# Patient Record
Sex: Female | Born: 1935 | Race: White | Hispanic: No | Smoking: Never smoker
Health system: Southern US, Community
[De-identification: ages and names within clinical notes are randomized; demographics above are authoritative.]

## PROBLEM LIST (undated history)

## (undated) DIAGNOSIS — E785 Hyperlipidemia, unspecified: Secondary | ICD-10-CM

## (undated) DIAGNOSIS — I509 Heart failure, unspecified: Secondary | ICD-10-CM

## (undated) DIAGNOSIS — N19 Unspecified kidney failure: Secondary | ICD-10-CM

## (undated) DIAGNOSIS — D649 Anemia, unspecified: Secondary | ICD-10-CM

## (undated) DIAGNOSIS — J449 Chronic obstructive pulmonary disease, unspecified: Secondary | ICD-10-CM

## (undated) DIAGNOSIS — I739 Peripheral vascular disease, unspecified: Secondary | ICD-10-CM

## (undated) HISTORY — PX: REPLACEMENT TOTAL KNEE: SUR1224

## (undated) HISTORY — PX: SHOULDER SURGERY: SHX246

## (undated) HISTORY — PX: ABDOMINAL HYSTERECTOMY: SHX81

---

## 2004-12-09 ENCOUNTER — Inpatient Hospital Stay: Payer: Self-pay | Admitting: General Practice

## 2004-12-13 ENCOUNTER — Other Ambulatory Visit: Payer: Self-pay

## 2004-12-17 ENCOUNTER — Other Ambulatory Visit: Payer: Self-pay

## 2007-11-28 IMAGING — CR DG KNEE 1-2V*R*
1 series · 2 of 2 positions shown · non-contrast
Comparison: none

REASON FOR EXAM: Postop
COMMENTS:  Bedside (portable):Y

[Series 1: view not recorded · 0.17mm/px · 2 of 2 slices shown]
[im 1/2]
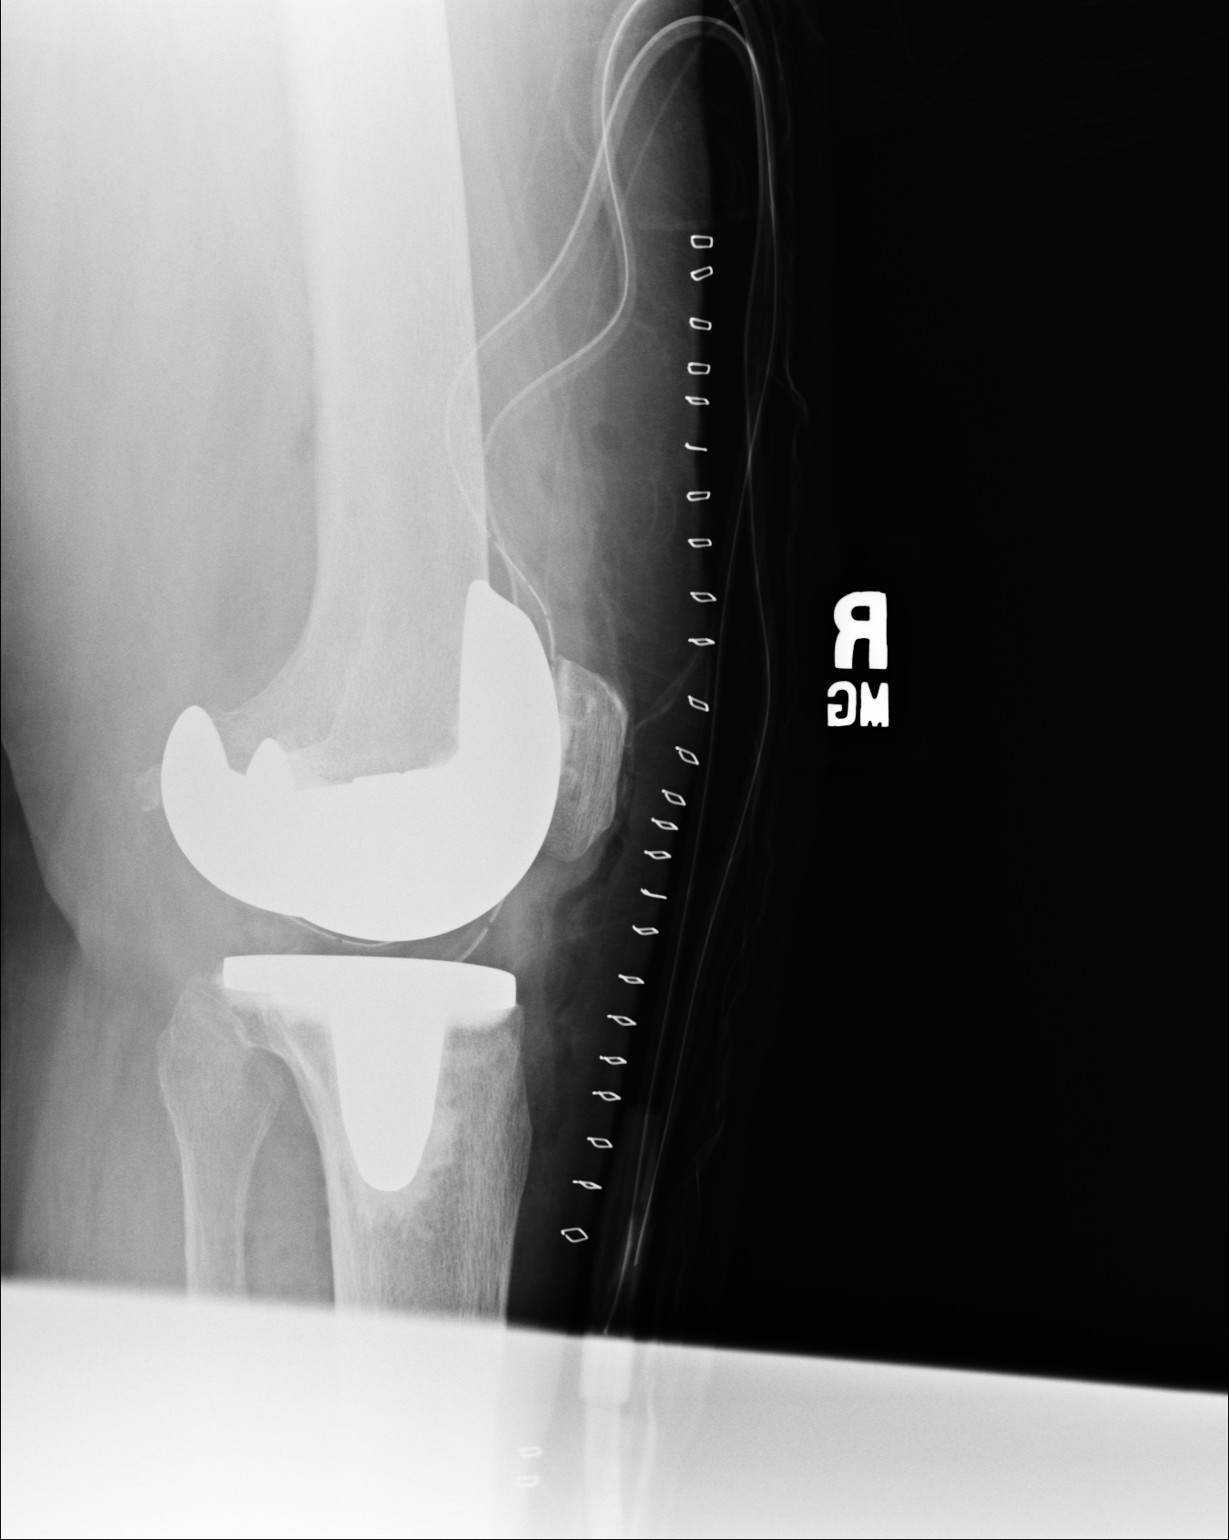
[im 2/2]
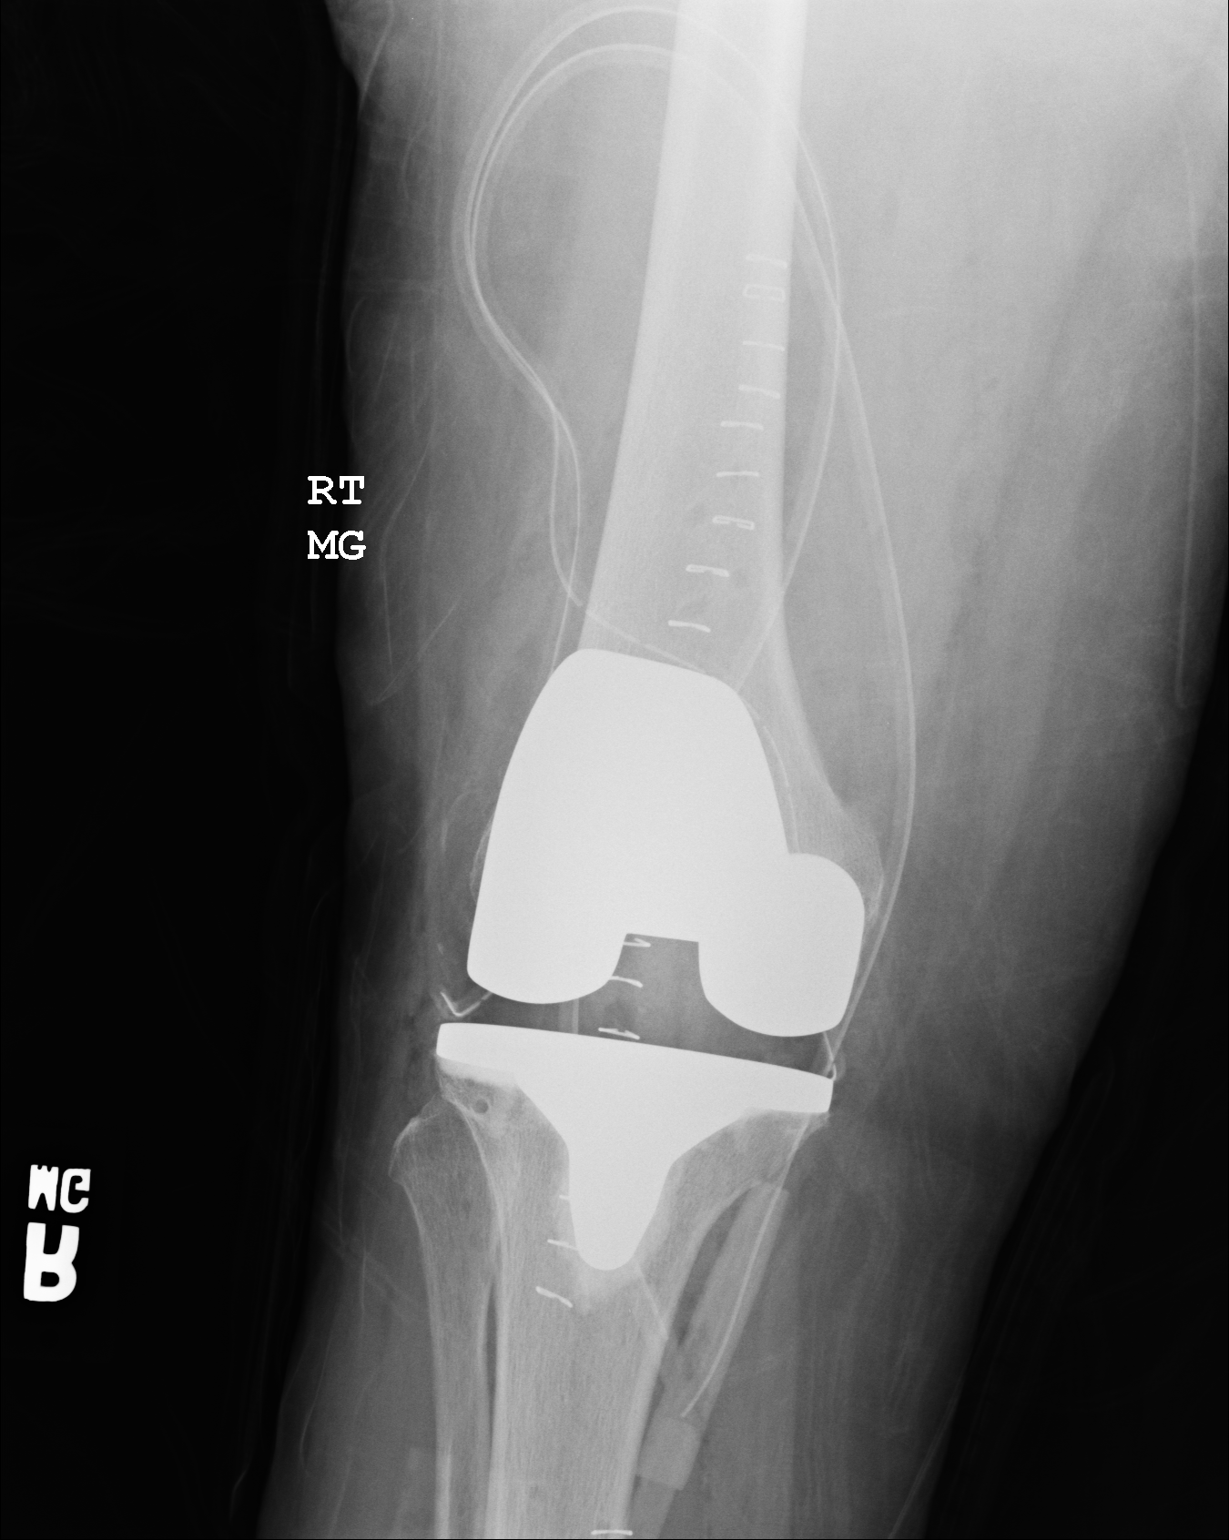

[2 of 2 positions shown; findings below may reference images not displayed]

PROCEDURE:     DXR - DXR KNEE RIGHT AP AND LATERAL  - December 09, 2004 [DATE]

RESULT:        The patient is status post RIGHT knee arthroplasty.  The
femoral and tibial components appear well seated without evidence of
loosening or hardware failure.  Skin staples are demonstrated about the skin
and surgical drains are demonstrated within the knee.  The native visualized
bony skeleton demonstrates no evidence of fracture or dislocation.
IMPRESSION: Status post RIGHT knee arthroplasty.  The remainder of the interpretation
will be left to the performing physician.

## 2007-11-28 IMAGING — CR DG CHEST 1V PORT
1 series · 1 of 1 positions shown · non-contrast
Comparison: none

REASON FOR EXAM: wheezing
COMMENTS:

PROCEDURE:     DXR - DXR PORTABLE CHEST SINGLE VIEW  - December 09, 2004 [DATE]
RESULT:     There is no evidence of focal infiltrates, effusions or edema.
The cardiac silhouette is within normal limits. The visualized bony skeleton
demonstrates no evidence of acute abnormalities.

[view not recorded]
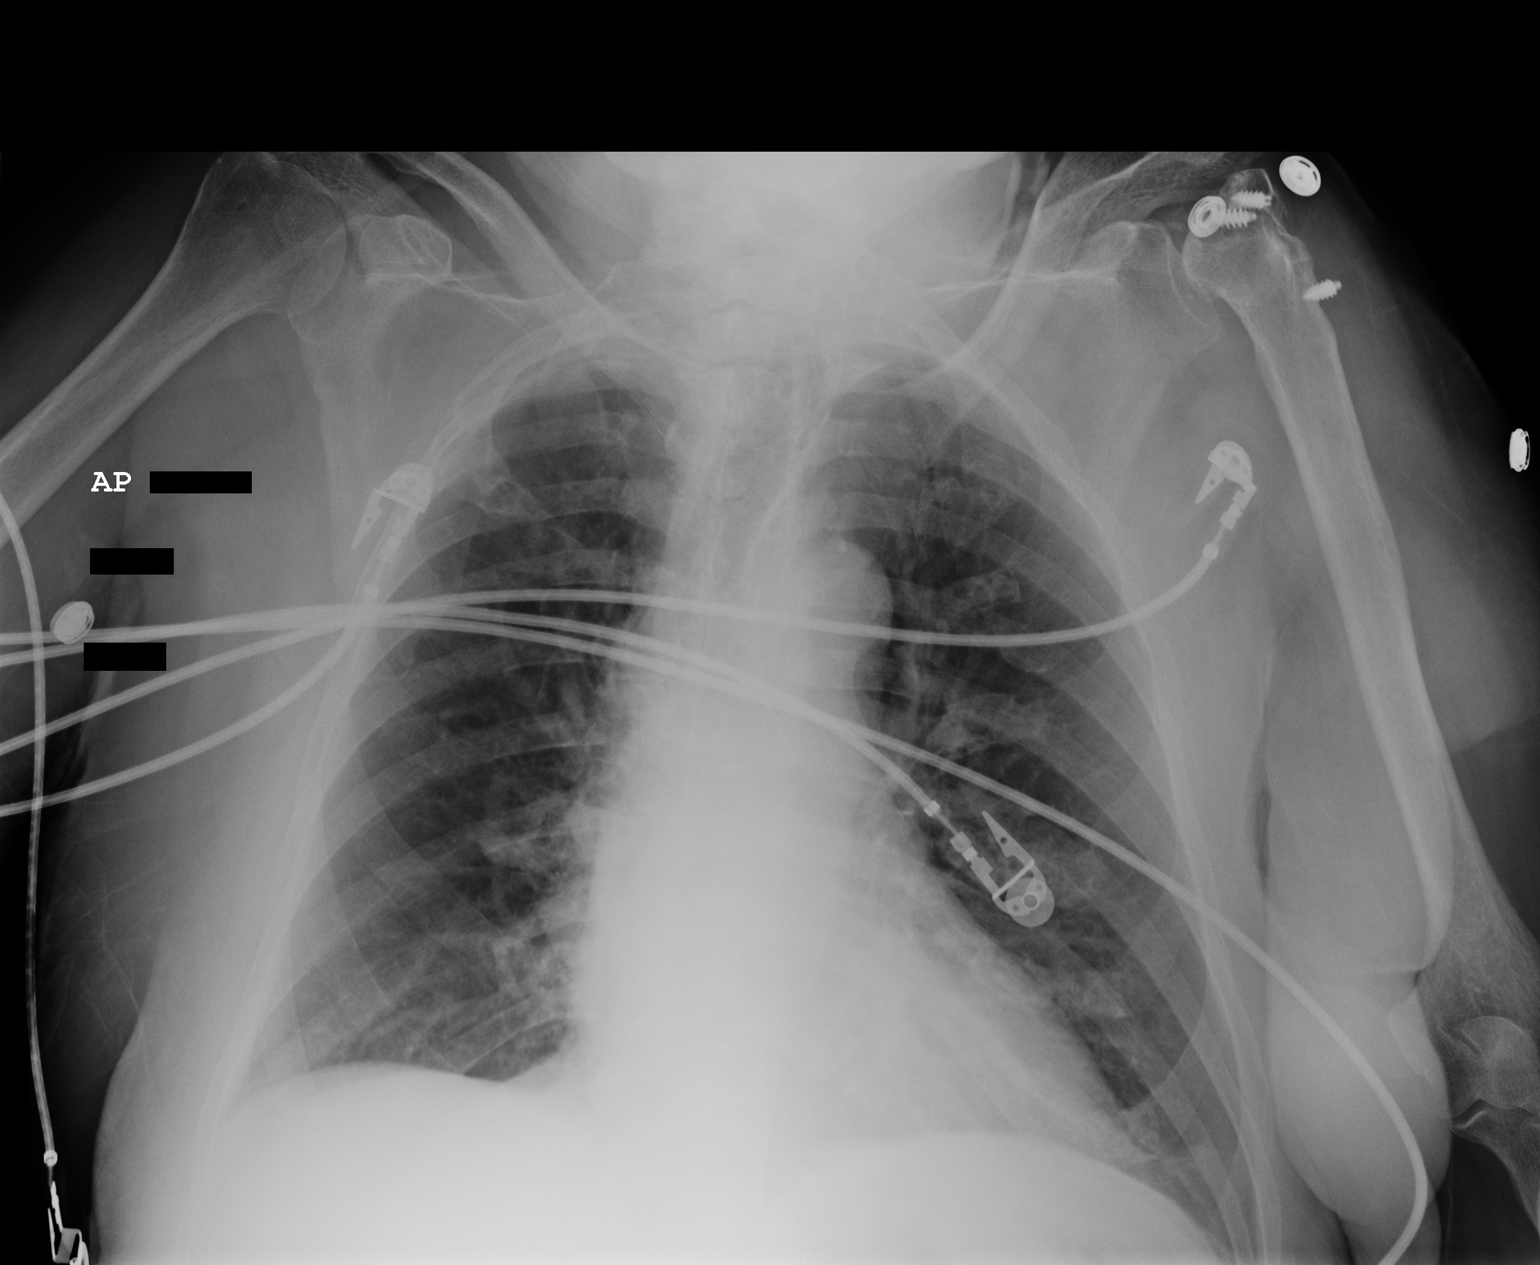

[1 of 1 positions shown; findings below may reference images not displayed]

IMPRESSION: 1)Chest radiograph without evidence of acute cardiopulmonary disease.

## 2007-11-30 IMAGING — CR DG CHEST 1V
1 series · 1 of 1 positions shown · non-contrast
Comparison: none

REASON FOR EXAM: Congestion
COMMENTS:

[view not recorded]
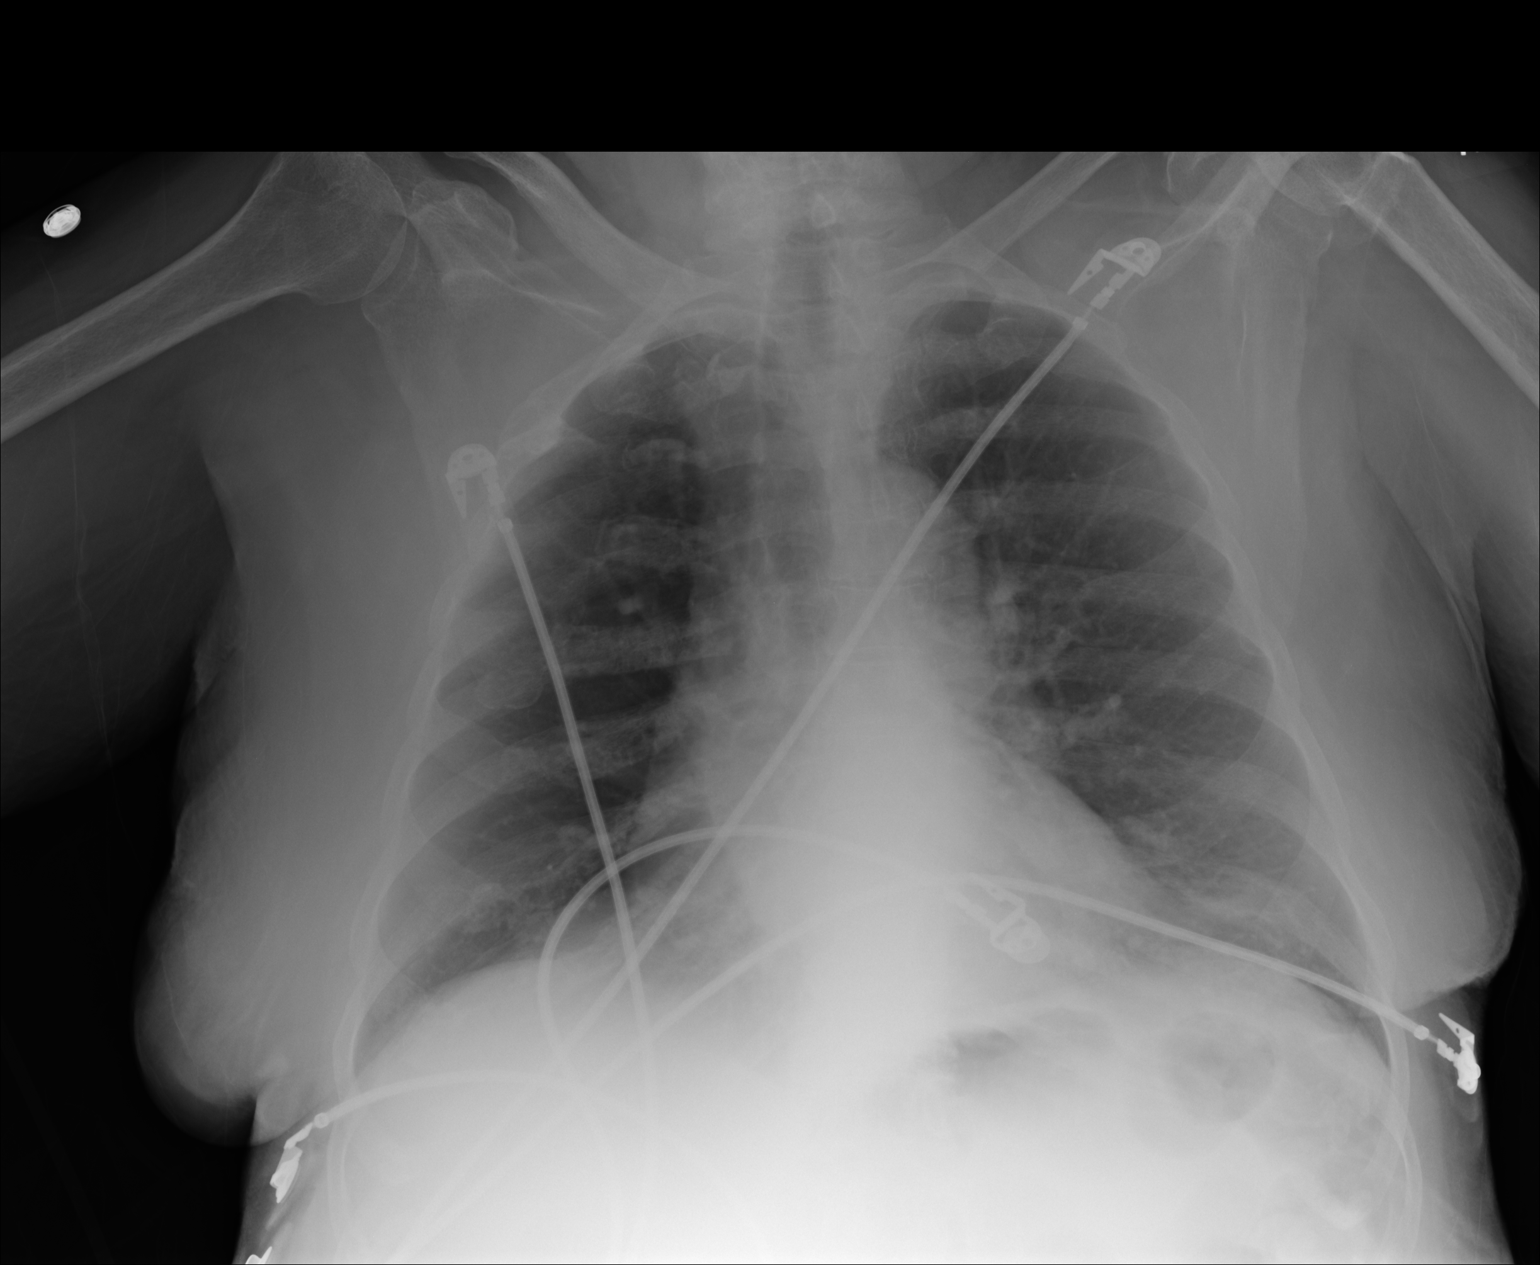

[1 of 1 positions shown; findings below may reference images not displayed]

PROCEDURE:     DXR - DXR CHEST 1 VIEWAP OR PA  - December 11, 2004  [DATE]

RESULT:     Single AP view was obtained and compared to a prior exam of
12/09/2004.

The heart is within normal limits in size. The lung fields are clear. The
vascularity is within normal limits with no effusions noted. Post-surgical
changes are noted of the LEFT shoulder.
IMPRESSION: The lung fields are clear. The chest is basically unchanged
from the prior study.

## 2007-12-02 IMAGING — CR DG CHEST 1V PORT
1 series · 1 of 1 positions shown · non-contrast
Comparison: none

REASON FOR EXAM: Evaluate for pneumonia, infiltrate
COMMENTS:

PROCEDURE:     DXR - DXR PORTABLE CHEST SINGLE VIEW  - December 13, 2004  [DATE]
RESULT:          AP portable exam was obtained and compared to the prior
study of 12/11/2004.
The heart remains normal in size.  The lung fields appear clear with no
effusions.

[view not recorded]
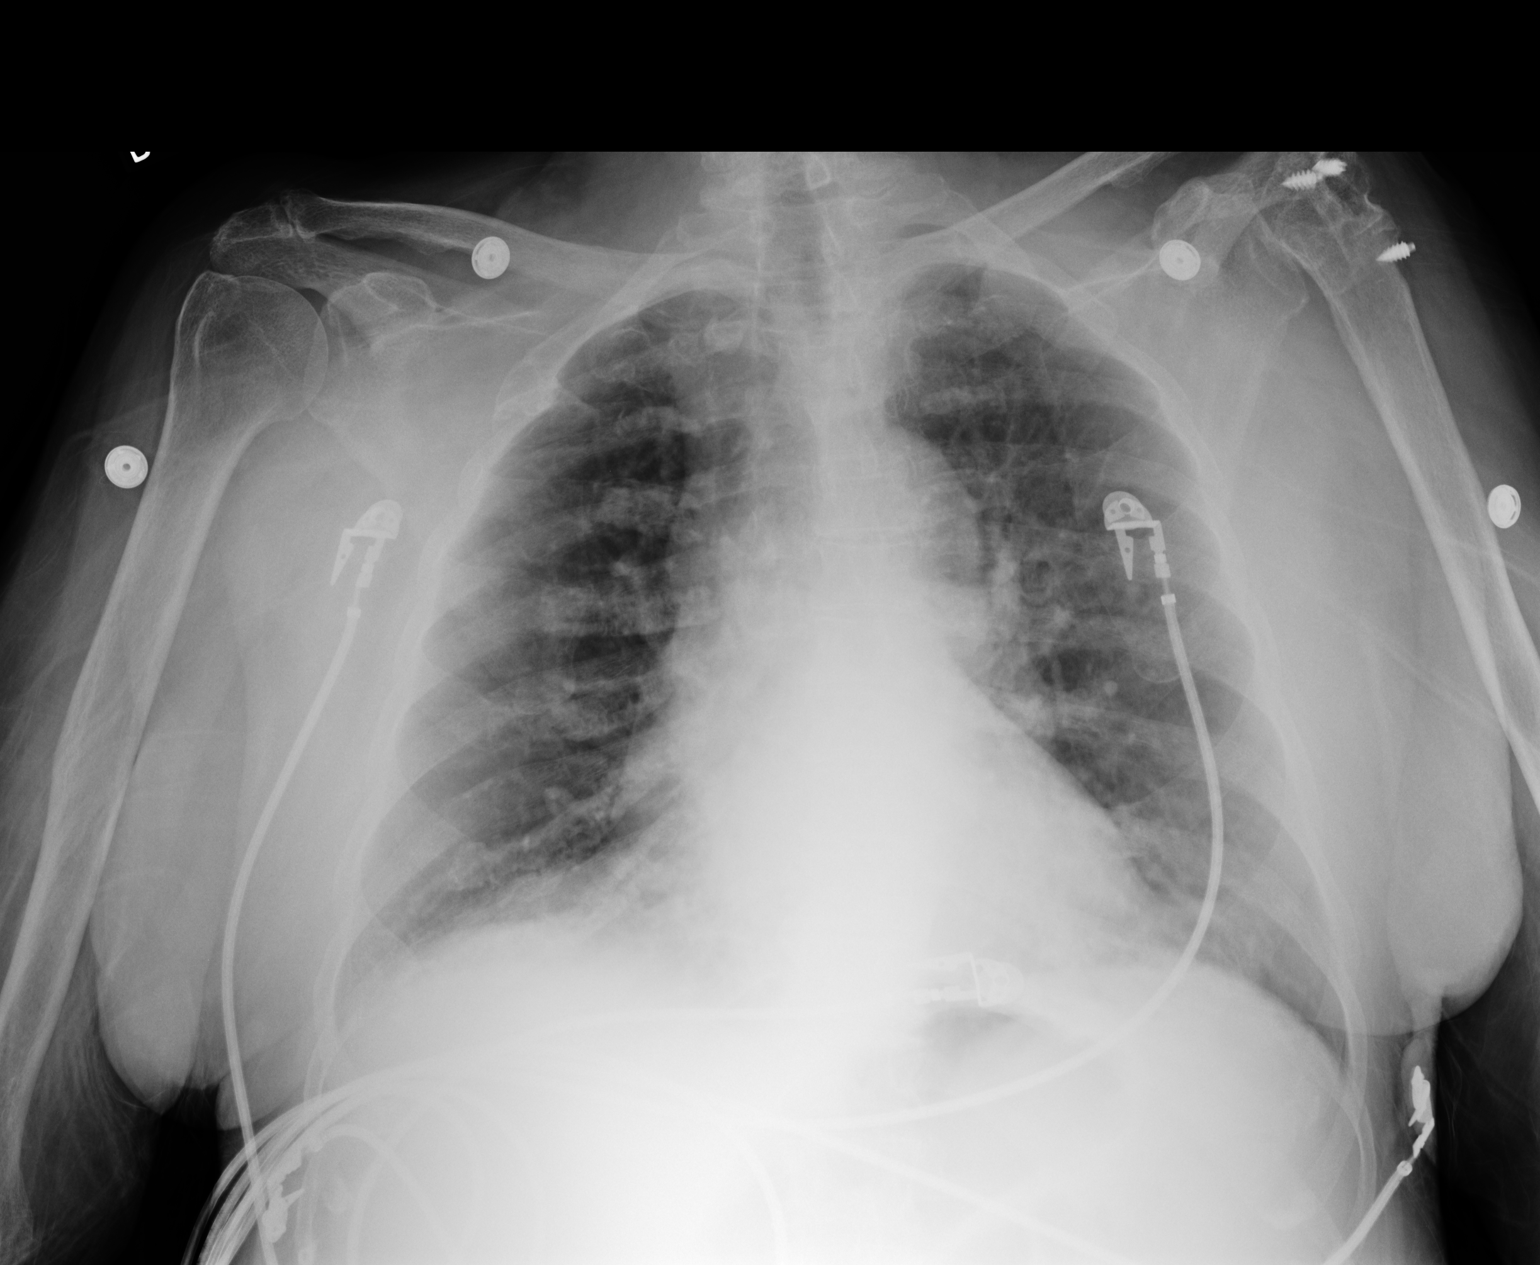

[1 of 1 positions shown; findings below may reference images not displayed]

IMPRESSION: The lung fields are clear.

## 2007-12-04 IMAGING — CT CT CHEST W/ CM
1 series · 15 of 34 positions shown, 19 images · non-contrast
Comparison: none

REASON FOR EXAM: Hypoxemia, tachycardia
COMMENTS:

[Series 4: soft tissue · axial · 0.59mm/px · z∈[-592,-330]mm · 15 of 103 slices shown, 19 images]
[im 8/103  mediastinal]
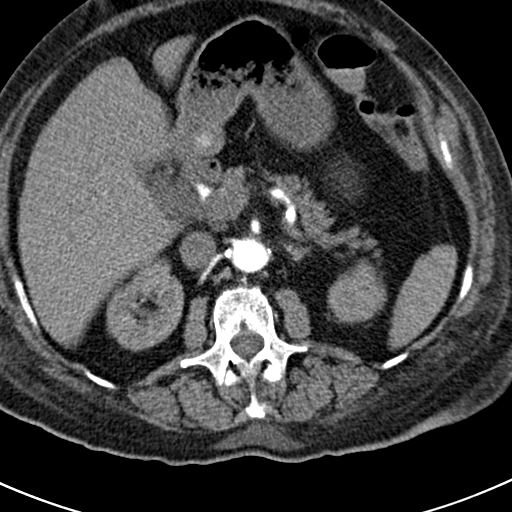
[im 8/103  lung]
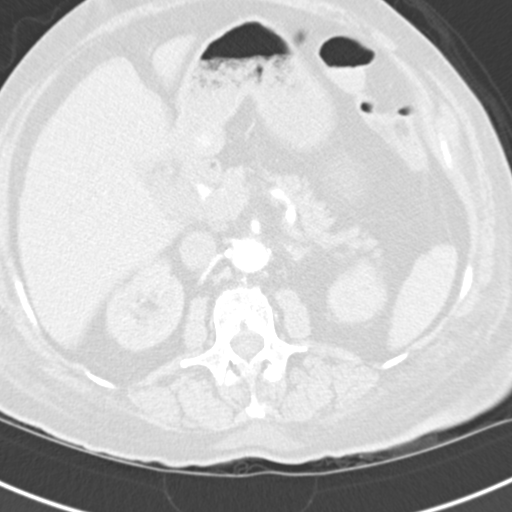
[im 16/103  lung]
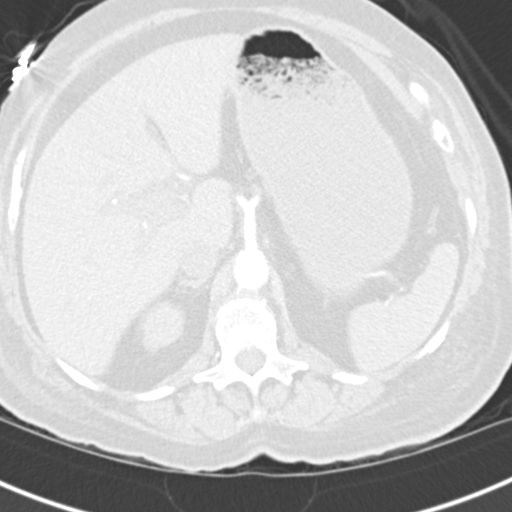
[im 21/103  lung]
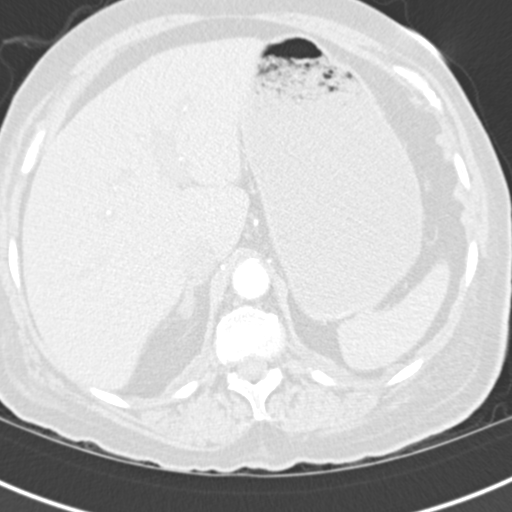
[im 27/103  lung]
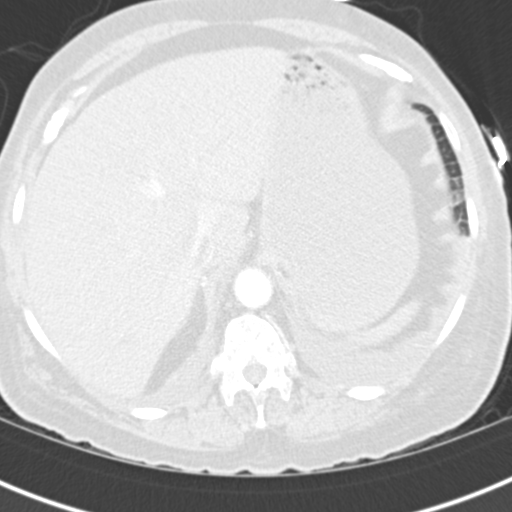
[im 35/103  mediastinal]
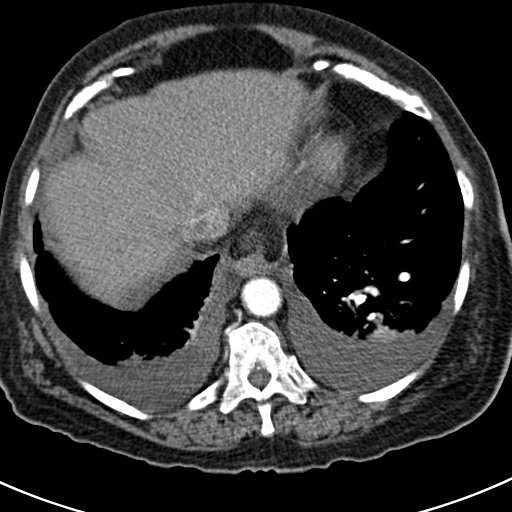
[im 35/103  lung]
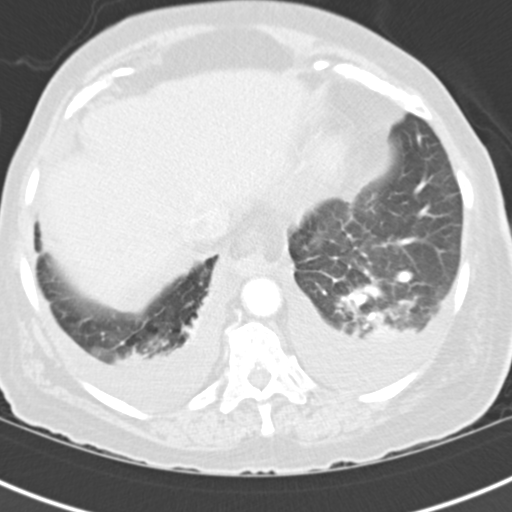
[im 41/103  lung]
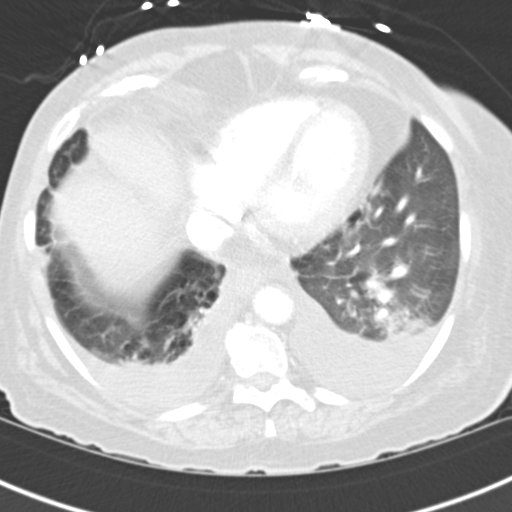
[im 46/103  lung]
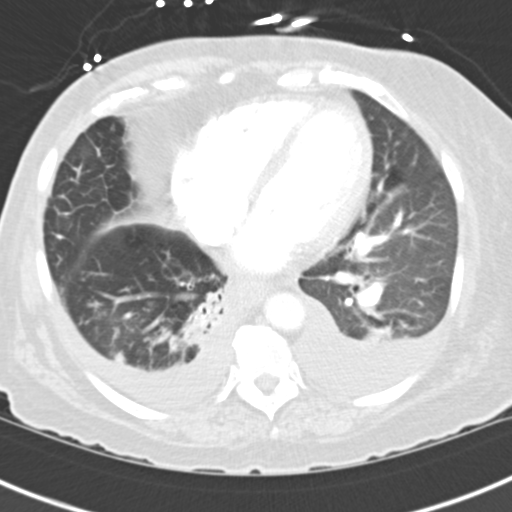
[im 53/103  lung]
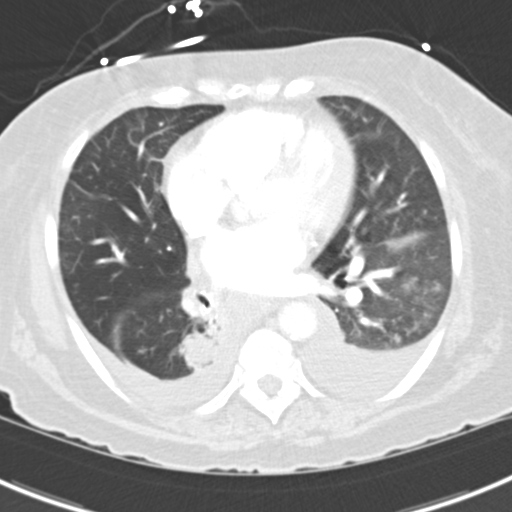
[im 57/103  mediastinal]
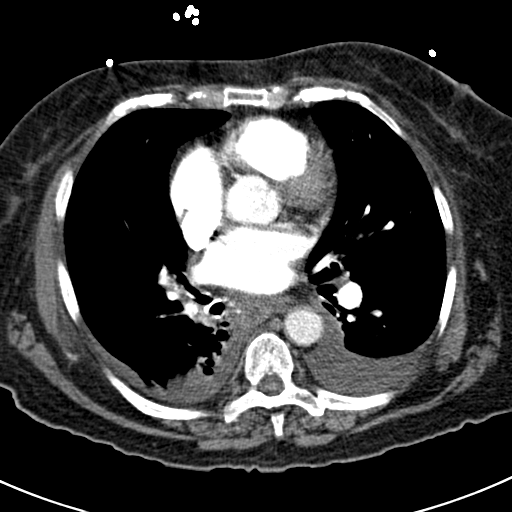
[im 57/103  lung]
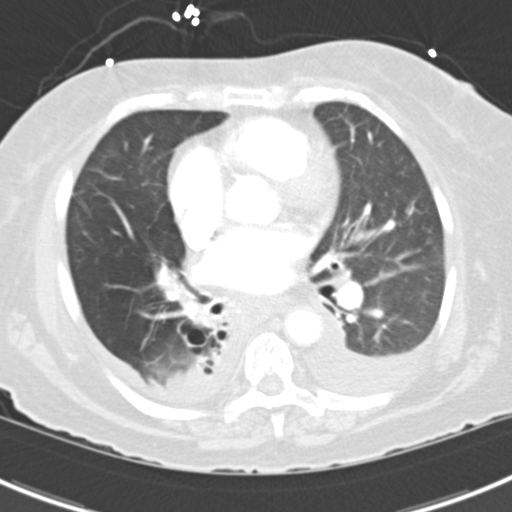
[im 62/103  lung]
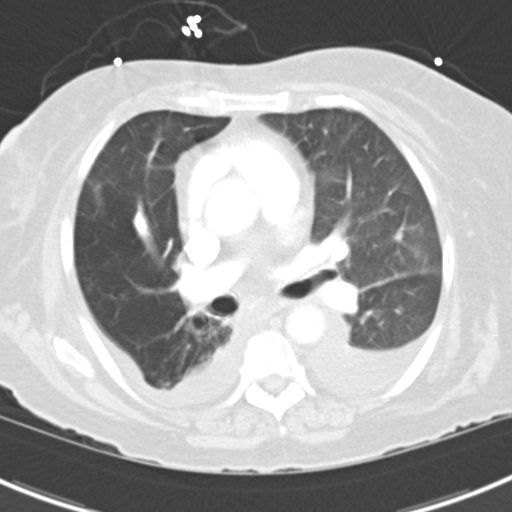
[im 69/103  lung]
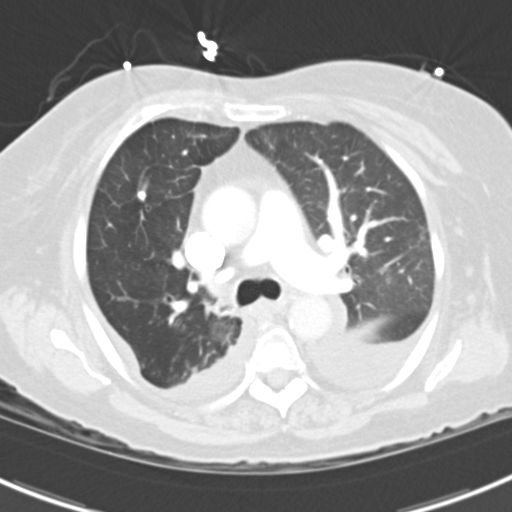
[im 76/103  lung]
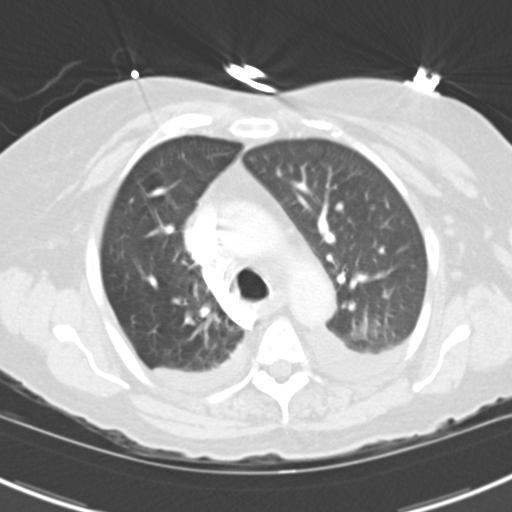
[im 82/103  mediastinal]
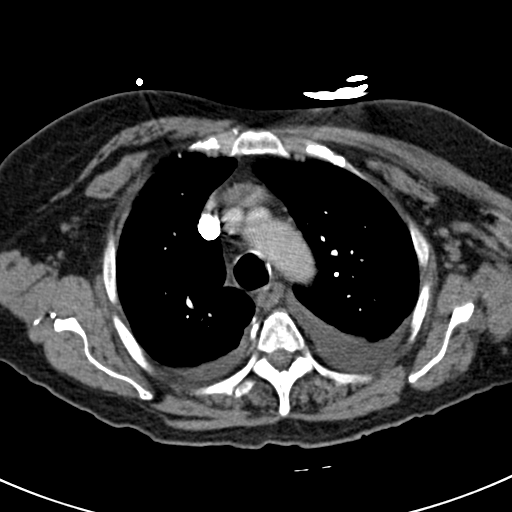
[im 82/103  lung]
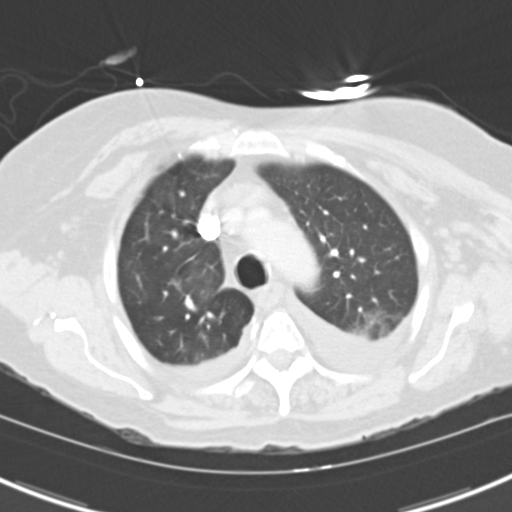
[im 87/103  lung]
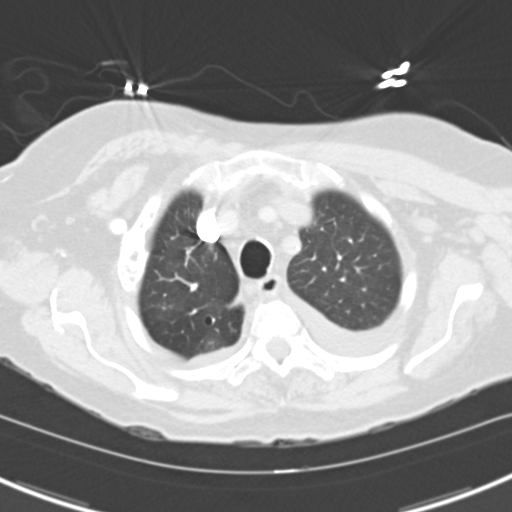
[im 95/103  lung]
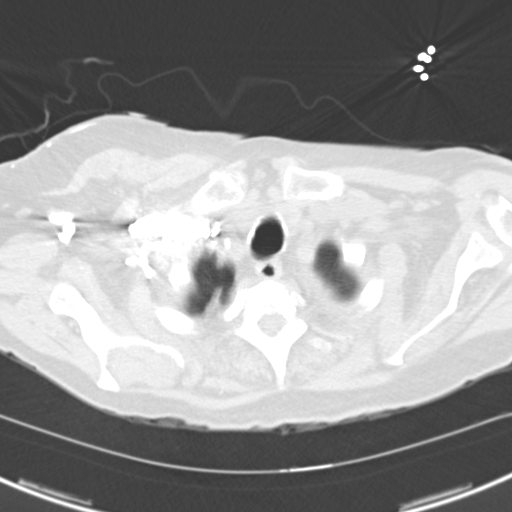

[15 of 34 positions shown; findings below may reference images not displayed]

PROCEDURE:     CT  - CT CHEST (FOR PE) W  - December 15, 2004  [DATE]

RESULT:     Spiral 3.0 mm sections were obtained from the thoracic inlet
through the lung bases status post intravenous administration of 100 ml of
[id number].

Evaluation of the mediastinum and hilar regions and structures demonstrates
no evidence of mediastinal masses or adenopathy. Evaluation of the pulmonary
arterial system demonstrates a filling defect within a lower lobe segmental
pulmonary artery. This is in a region of consolidated density within the
RIGHT lung base as well as a small, RIGHT effusion. This filling defect may
represent the sequelae of a pulmonary embolus versus possibly an element of
hypoperfusion secondary to the lung disease. The remaining pulmonary
arterial system demonstrates no further evidence of filling defects to
suggest further pulmonary emboli. Evaluation of the lung parenchyma
demonstrates patchy areas of increased density within the lung bases with
consolidated density in the RIGHT lung base. Small, bilateral effusions are
appreciated, LEFT greater than RIGHT. Evaluation of the visualized upper
abdominal viscera demonstrates no gross abnormalities.
IMPRESSION: 1.     Filling defects within a lower lobe segmental pulmonary artery
suspicious for a pulmonary embolus on the RIGHT.
2.     Consolidated densities within the lung bases with atelectasis versus
infiltrate as well as small effusions.
3.     Not mentioned above, there does appear to be an element of pulmonary
edema and there is multi-chamber cardiac enlargement.
4.     Dr. Klpigbb Moolman of the [[facility]] was informed of the
findings of a pulmonary embolus at the time of this initial interpretation.

## 2007-12-04 IMAGING — CR DG CHEST 2V
1 series · 2 of 2 positions shown · non-contrast
Comparison: none

REASON FOR EXAM: crackles > on R
COMMENTS:

[Series 1: view not recorded · 0.17mm/px · 2 of 2 slices shown]
[im 1/2]
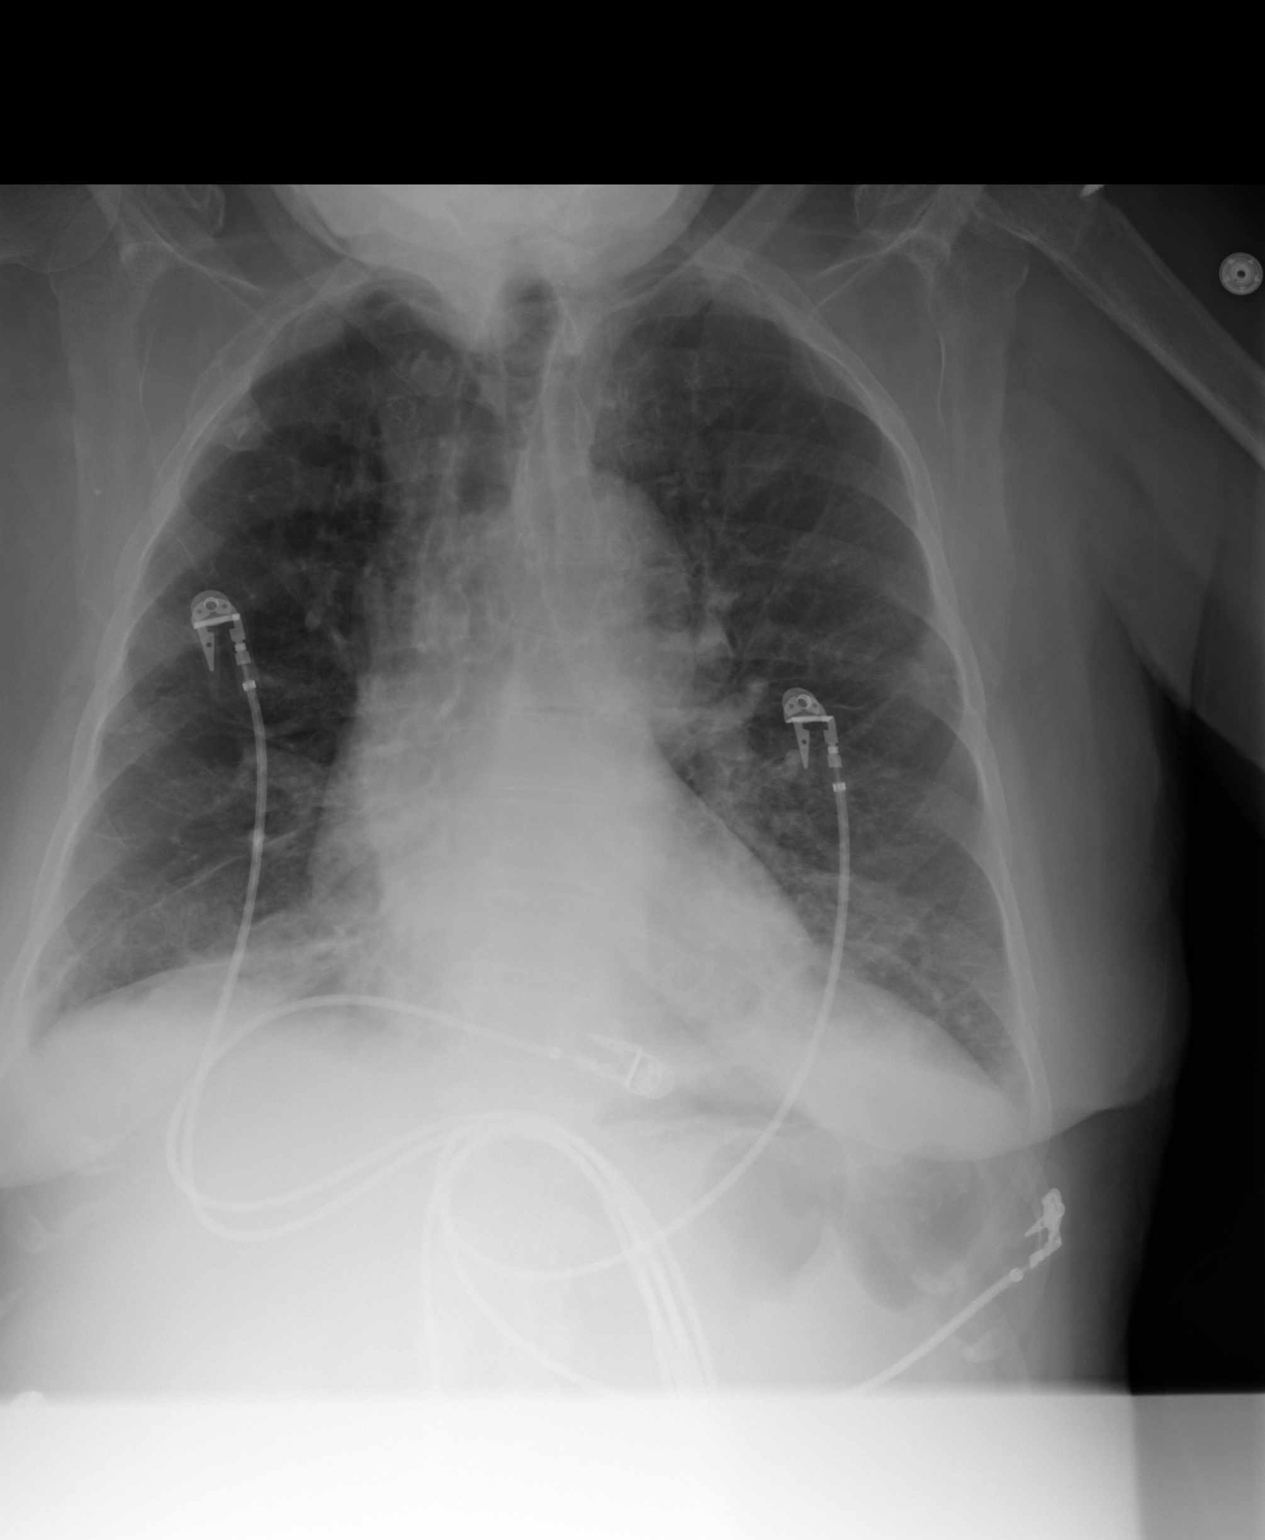
[im 2/2]
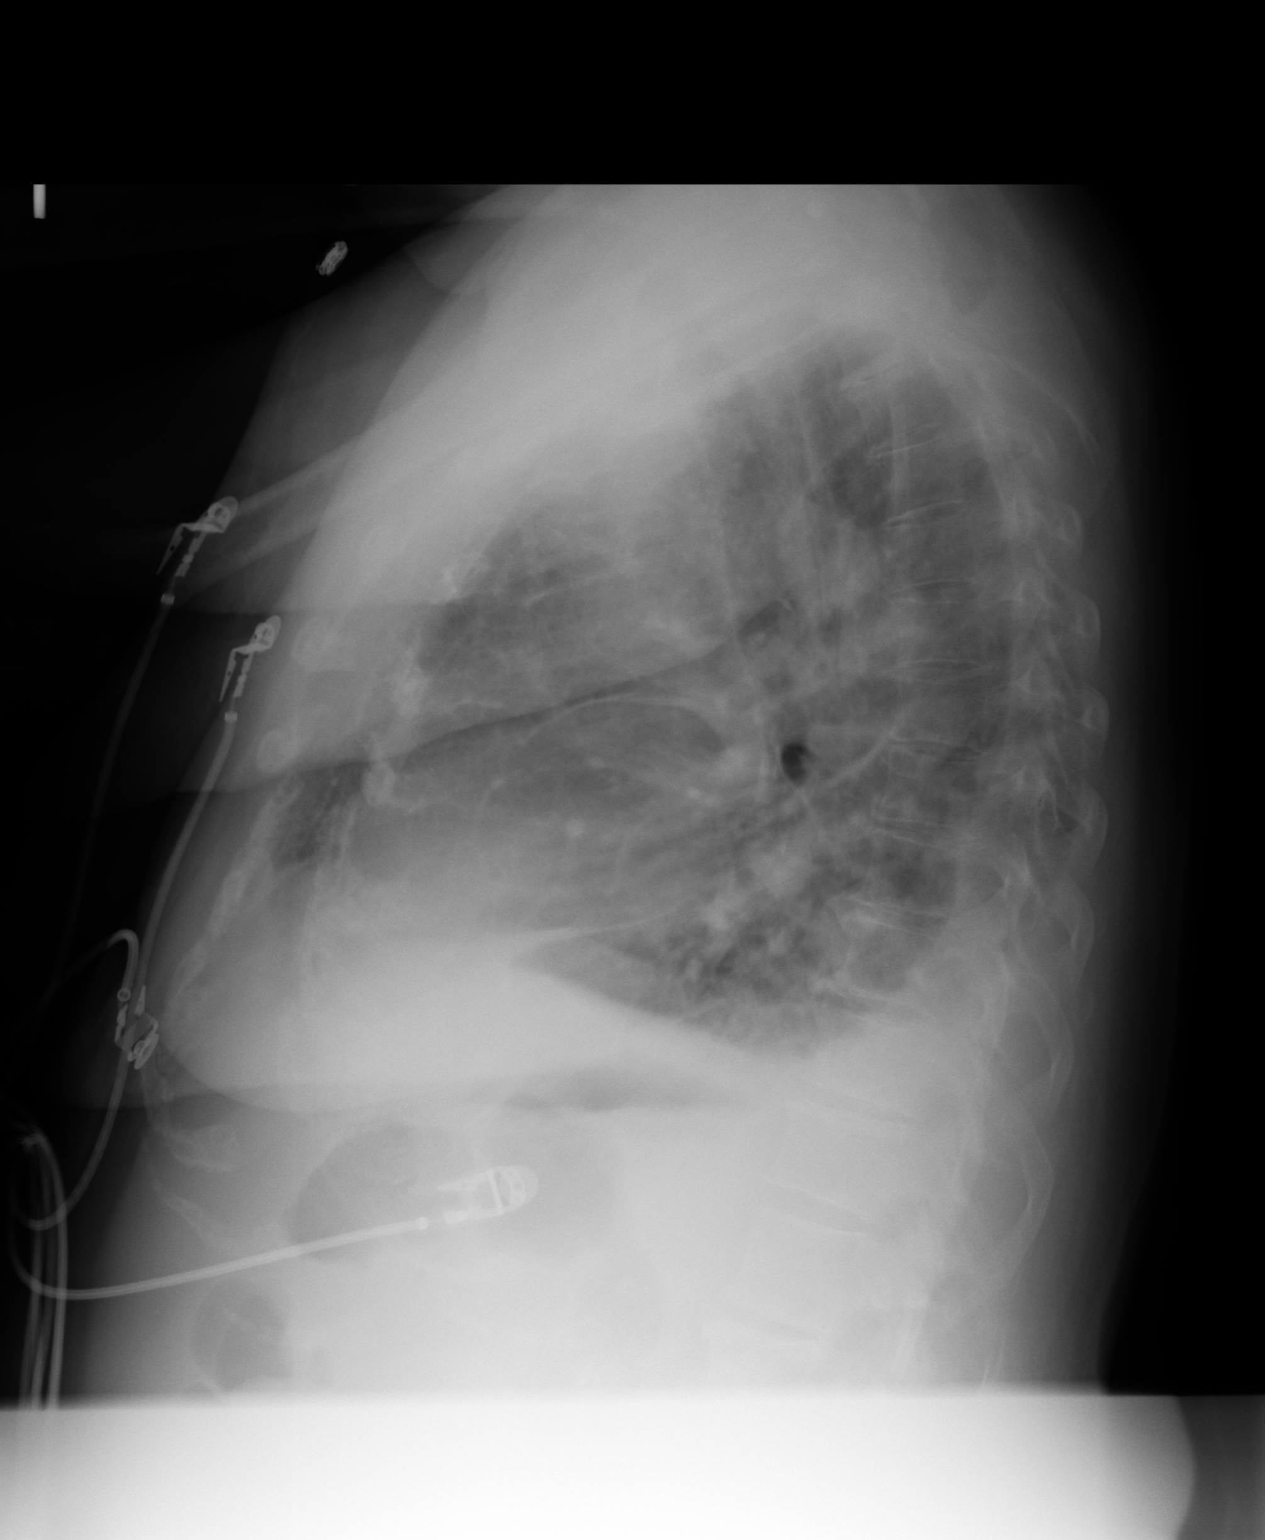

[2 of 2 positions shown; findings below may reference images not displayed]

PROCEDURE:     DXR - DXR CHEST PA (OR AP) AND LATERAL  - December 15, 2004  [DATE]

RESULT:     The current exam is compared to the prior exam of 12-13-04.  The
current exam shows patchy thickening of the LEFT basilar markings compatible
with pneumonia or atelectasis.  The RIGHT lung field is clear. No pulmonary
edema is seen. Heart size is normal. Monitoring electrodes are present.
IMPRESSION: 1)There is mild patchy thickening of the LEFT basilar markings compatible
with pneumonia or atelectasis.

## 2007-12-05 IMAGING — US US EXTREM LOW VENOUS BILAT
1 series · 17 of 24 positions shown · non-contrast
Comparison: none

REASON FOR EXAM: Possible PE
COMMENTS:

[Series 1: us extrem low venous bilat · 17 of 37 slices shown]
[im 1/37]
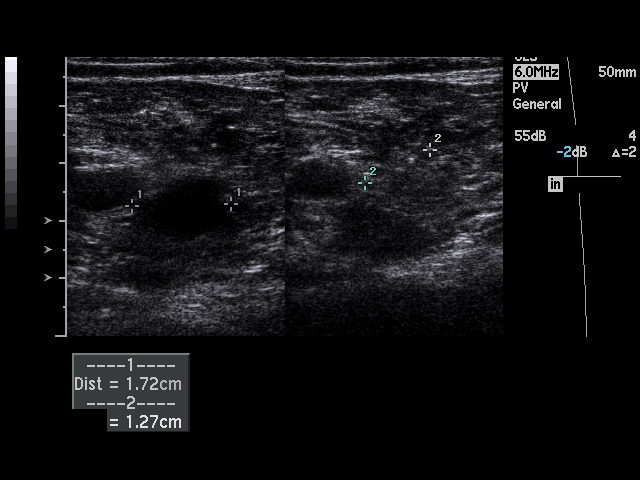
[im 4/37]
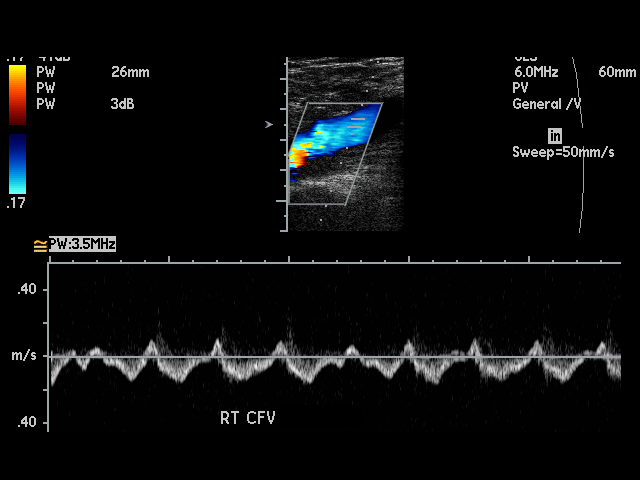
[im 5/37]
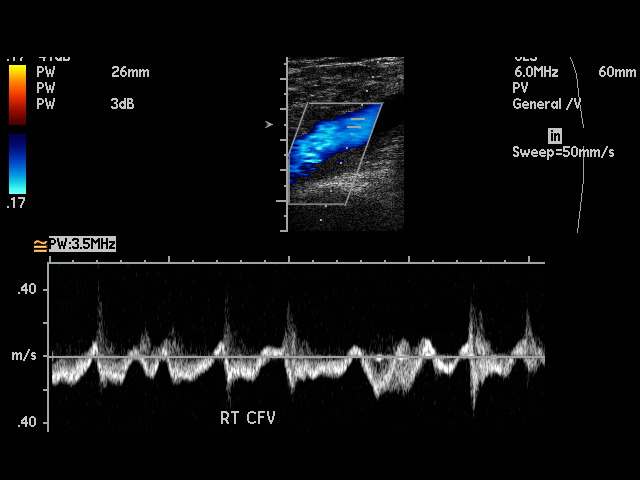
[im 7/37]
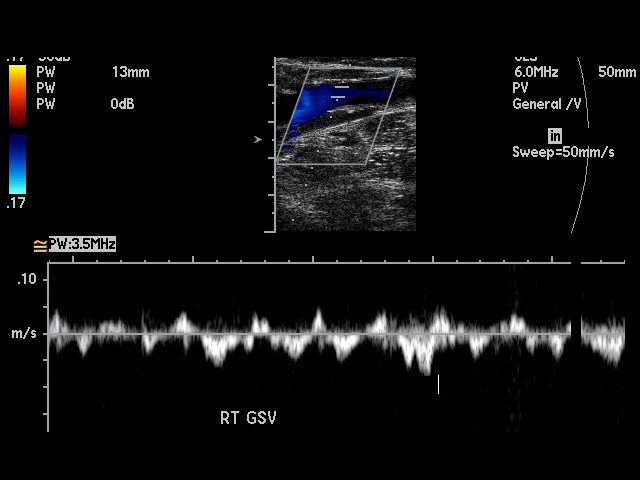
[im 10/37]
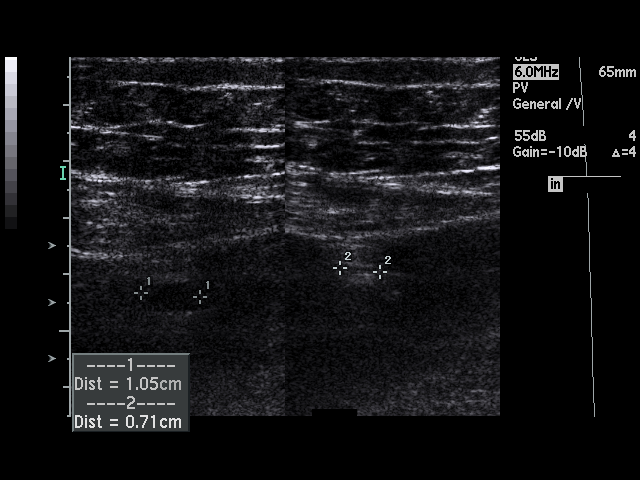
[im 11/37]
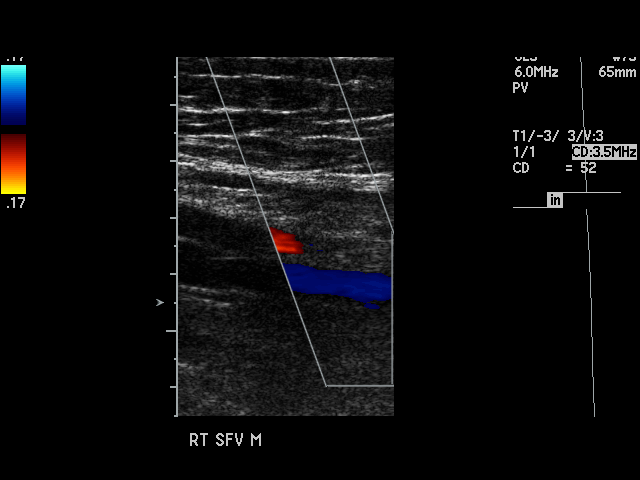
[im 15/37]
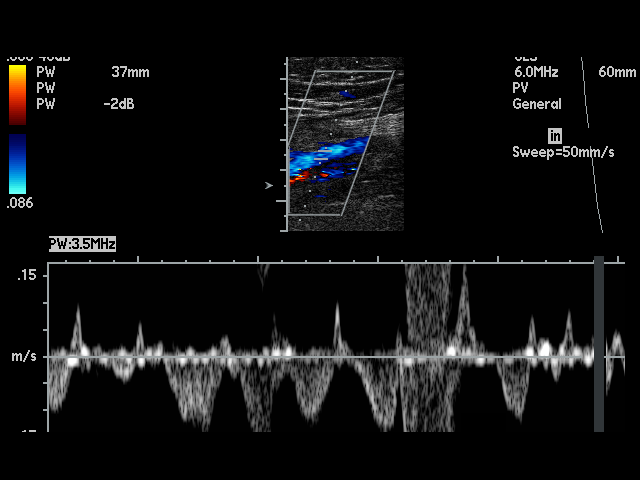
[im 16/37]
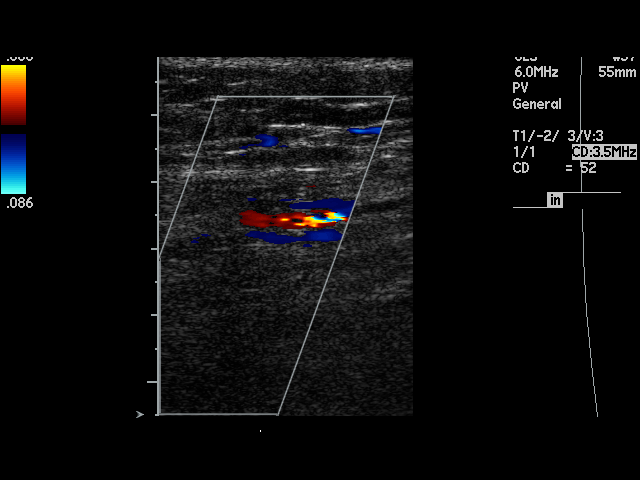
[im 19/37]
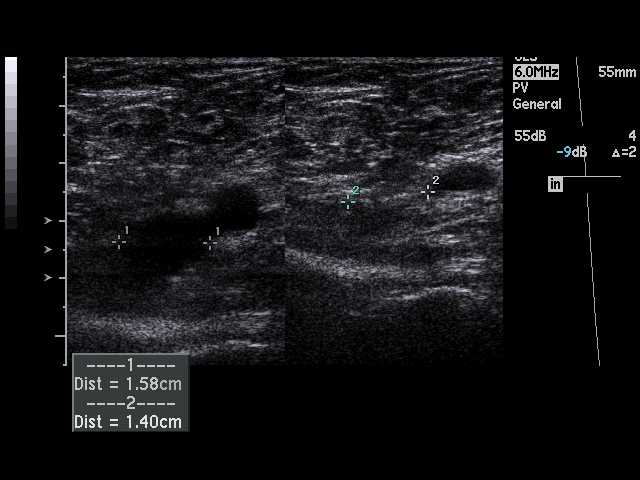
[im 21/37]
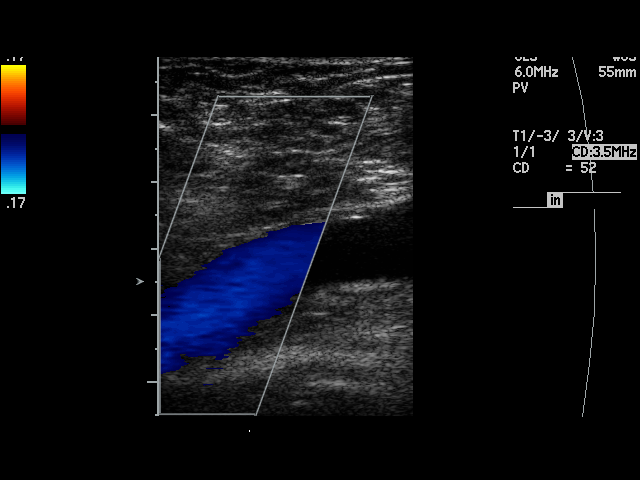
[im 22/37]
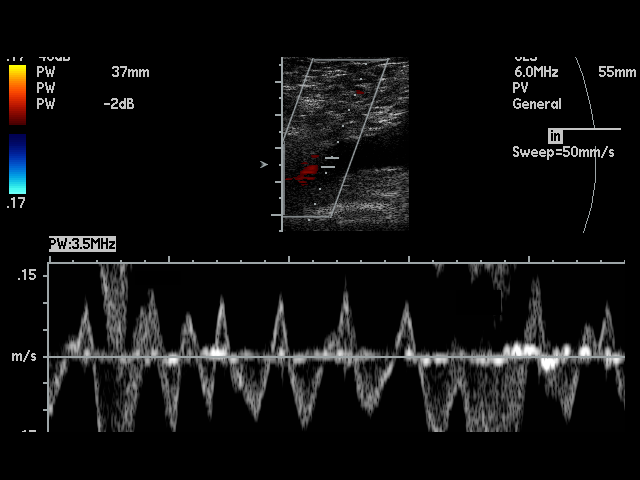
[im 26/37]
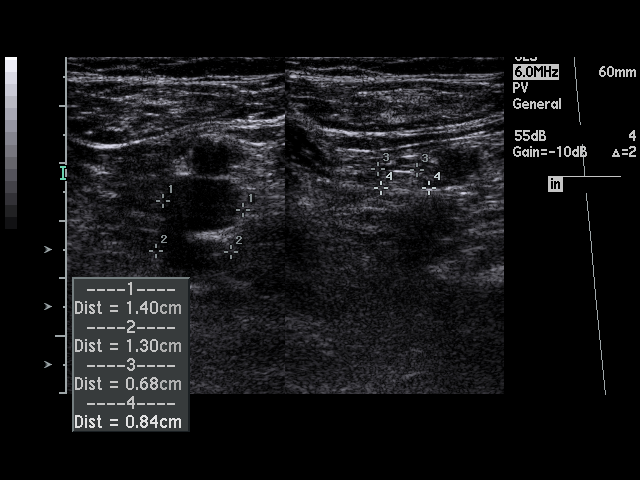
[im 27/37]
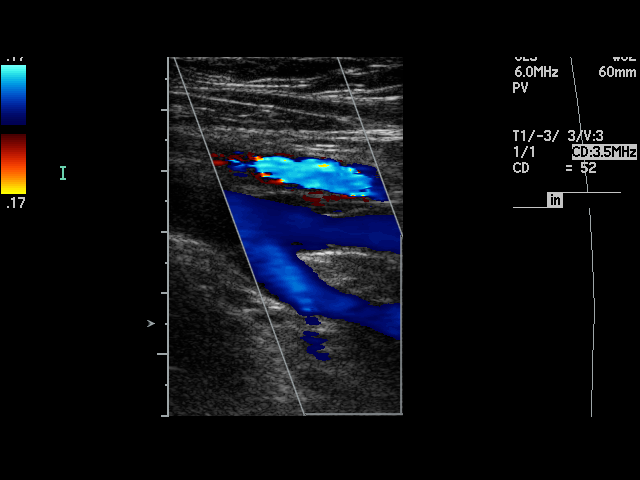
[im 30/37]
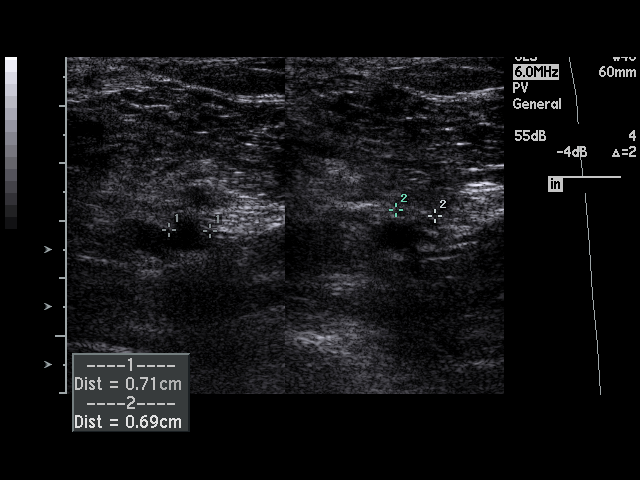
[im 32/37]
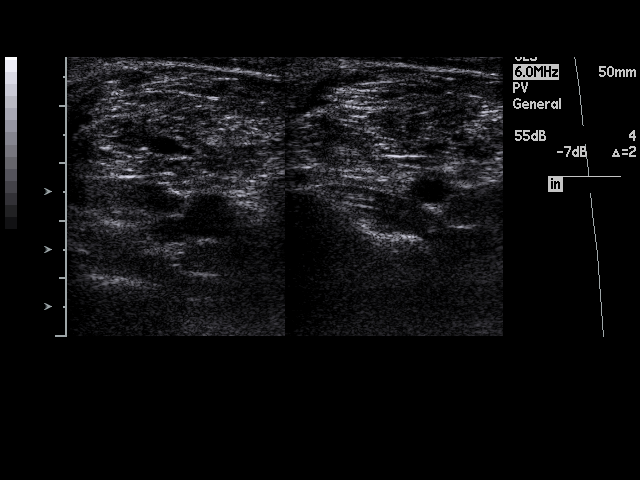
[im 33/37]
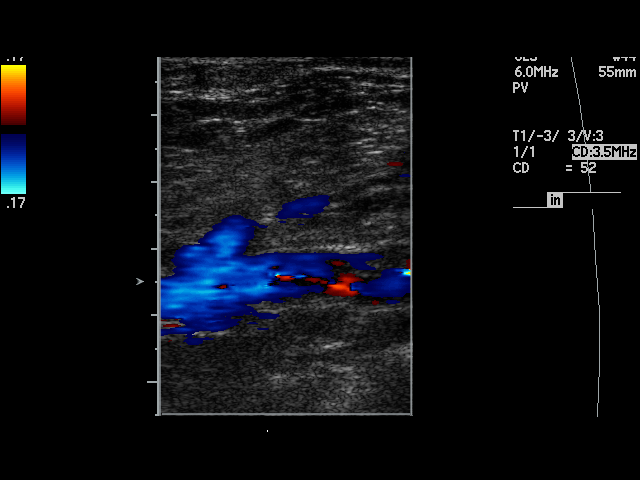
[im 37/37]
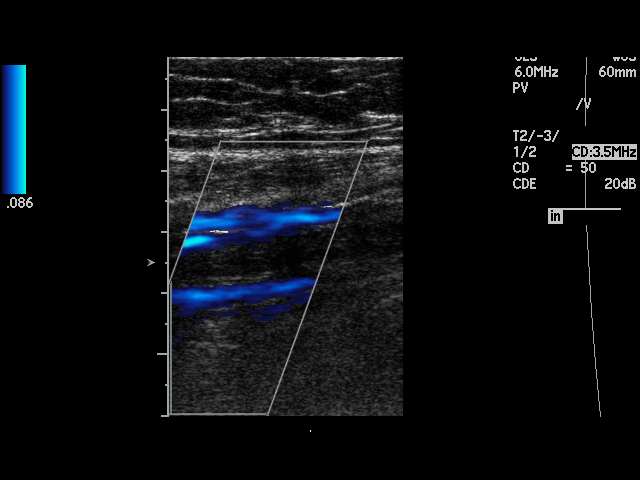

[17 of 24 positions shown; findings below may reference images not displayed]

PROCEDURE:     US  - US DOPPLER LOW EXTR BILATERAL  - December 16, 2004  [DATE]

RESULT:     Bilateral lower extremity deep venous ultrasound and color
Doppler examination is performed.

The phasic, augmentation and Valsalva flow waveforms bilaterally are normal
in appearance. The femoral and popliteal veins bilaterally show normal
compressibility. Bilateral Doppler examination shows no deep venous
occlusion.
IMPRESSION: Bilaterally normal study. No deep venous thrombosis is
identified on either side.

## 2009-03-16 ENCOUNTER — Ambulatory Visit: Payer: Self-pay | Admitting: Internal Medicine

## 2009-03-17 ENCOUNTER — Ambulatory Visit: Payer: Self-pay | Admitting: Internal Medicine

## 2009-03-17 ENCOUNTER — Inpatient Hospital Stay: Payer: Self-pay | Admitting: Internal Medicine

## 2009-03-31 ENCOUNTER — Ambulatory Visit: Payer: Self-pay | Admitting: Internal Medicine

## 2009-04-03 ENCOUNTER — Ambulatory Visit: Payer: Self-pay | Admitting: Internal Medicine

## 2009-04-28 ENCOUNTER — Emergency Department: Payer: Self-pay | Admitting: Emergency Medicine

## 2009-05-12 ENCOUNTER — Emergency Department: Payer: Self-pay | Admitting: Emergency Medicine

## 2009-05-12 ENCOUNTER — Ambulatory Visit: Payer: Self-pay | Admitting: Internal Medicine

## 2009-05-27 ENCOUNTER — Emergency Department: Payer: Self-pay | Admitting: Emergency Medicine

## 2009-05-28 ENCOUNTER — Ambulatory Visit: Payer: Self-pay | Admitting: Internal Medicine

## 2009-06-12 ENCOUNTER — Ambulatory Visit: Payer: Self-pay | Admitting: Internal Medicine

## 2009-08-30 ENCOUNTER — Emergency Department: Payer: Self-pay | Admitting: Emergency Medicine

## 2009-12-10 ENCOUNTER — Emergency Department: Payer: Self-pay | Admitting: Emergency Medicine

## 2010-04-01 ENCOUNTER — Ambulatory Visit: Payer: Self-pay | Admitting: Internal Medicine

## 2010-05-21 ENCOUNTER — Inpatient Hospital Stay: Payer: Self-pay | Admitting: Internal Medicine

## 2010-05-24 ENCOUNTER — Inpatient Hospital Stay: Payer: Self-pay | Admitting: Internal Medicine

## 2010-10-07 ENCOUNTER — Emergency Department: Payer: Self-pay | Admitting: Emergency Medicine

## 2010-12-13 ENCOUNTER — Inpatient Hospital Stay: Payer: Self-pay | Admitting: Family Medicine

## 2012-03-06 IMAGING — CR DG CHEST 1V PORT
1 series · 1 of 1 positions shown · non-contrast
Comparison: none

REASON FOR EXAM: SOB
COMMENTS:

[view not recorded]
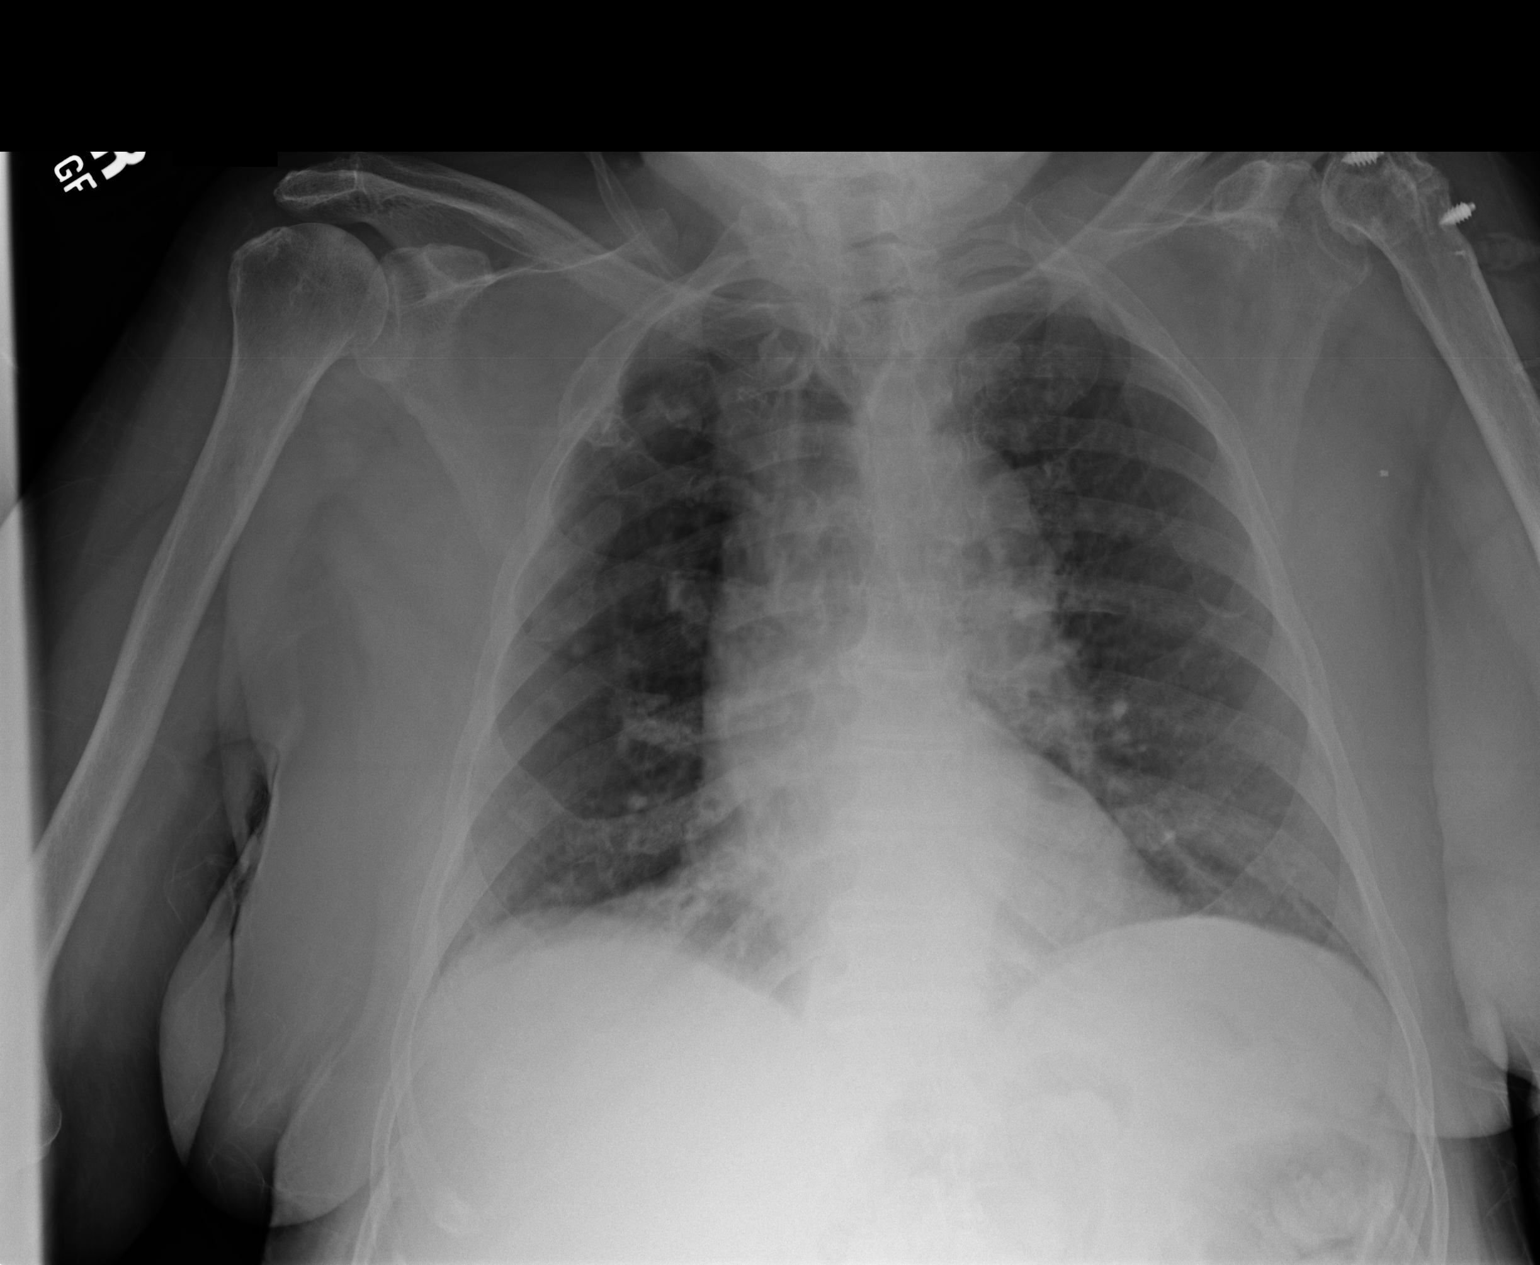

[1 of 1 positions shown; findings below may reference images not displayed]

PROCEDURE:     DXR - DXR PORTABLE CHEST SINGLE VIEW  - March 18, 2009  [DATE]

RESULT:     Comparison is made to the study of 03/17/2009.

Postsurgical changes are noted in the proximal left humerus with deformity
laterally and at least three screws present. The heart is enlarged. The
patient is rotated slightly toward the right. Some patchy ill-defined
increased density is seen in the right upper lobe some of which appears to
be artifactual secondary to costochondral calcifications and bony spurring
at the tip of the first rib. The left lung is clear. There is no definite
edema or significant effusion.
IMPRESSION: 1. No definite acute cardiopulmonary disease. The possibility of some
minimal right upper lobe infiltrate is felt to be small.
2. Deformity of the proximal left humerus presumably secondary to previous
surgery.

## 2012-03-08 IMAGING — CR DG CHEST 2V
1 series · 3 of 3 positions shown · non-contrast
Comparison: none

REASON FOR EXAM: follow up
COMMENTS:

[Series 1: view not recorded · 0.17mm/px · 3 of 3 slices shown]
[im 1/3]
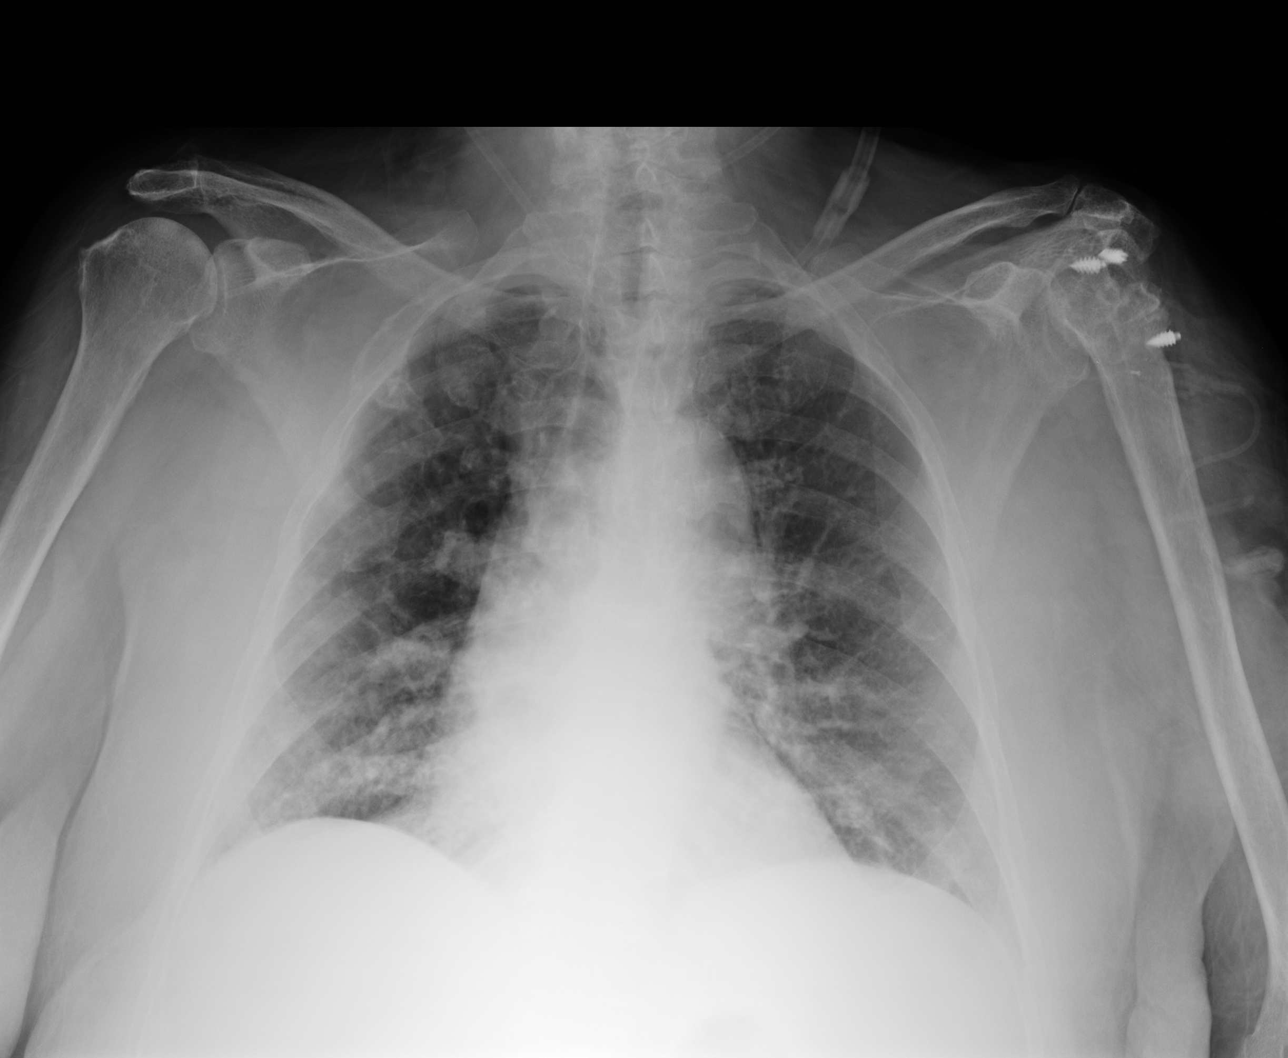
[im 2/3]
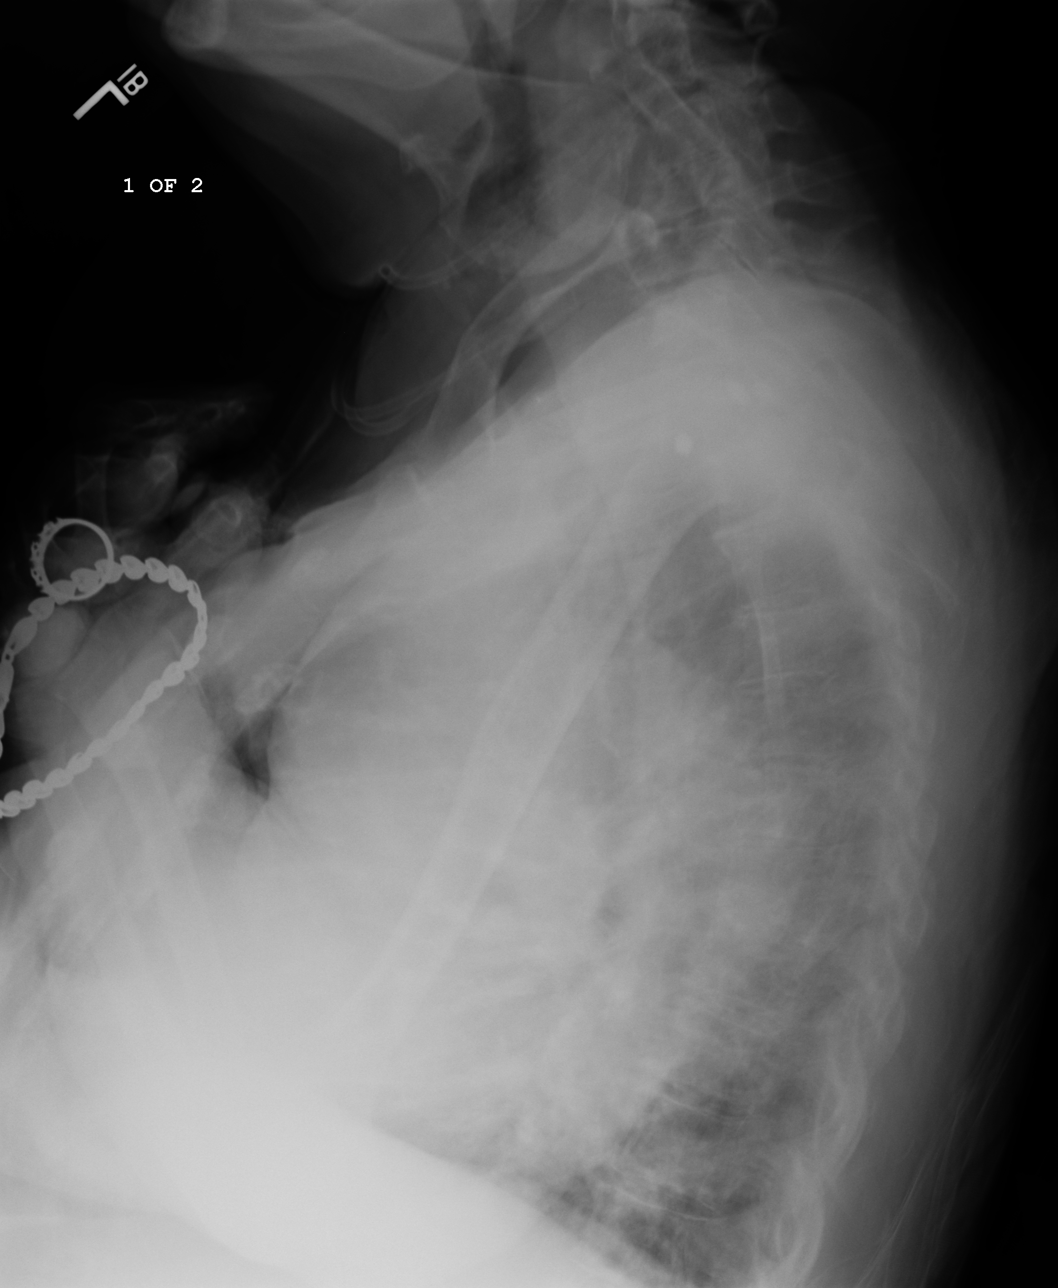
[im 3/3]
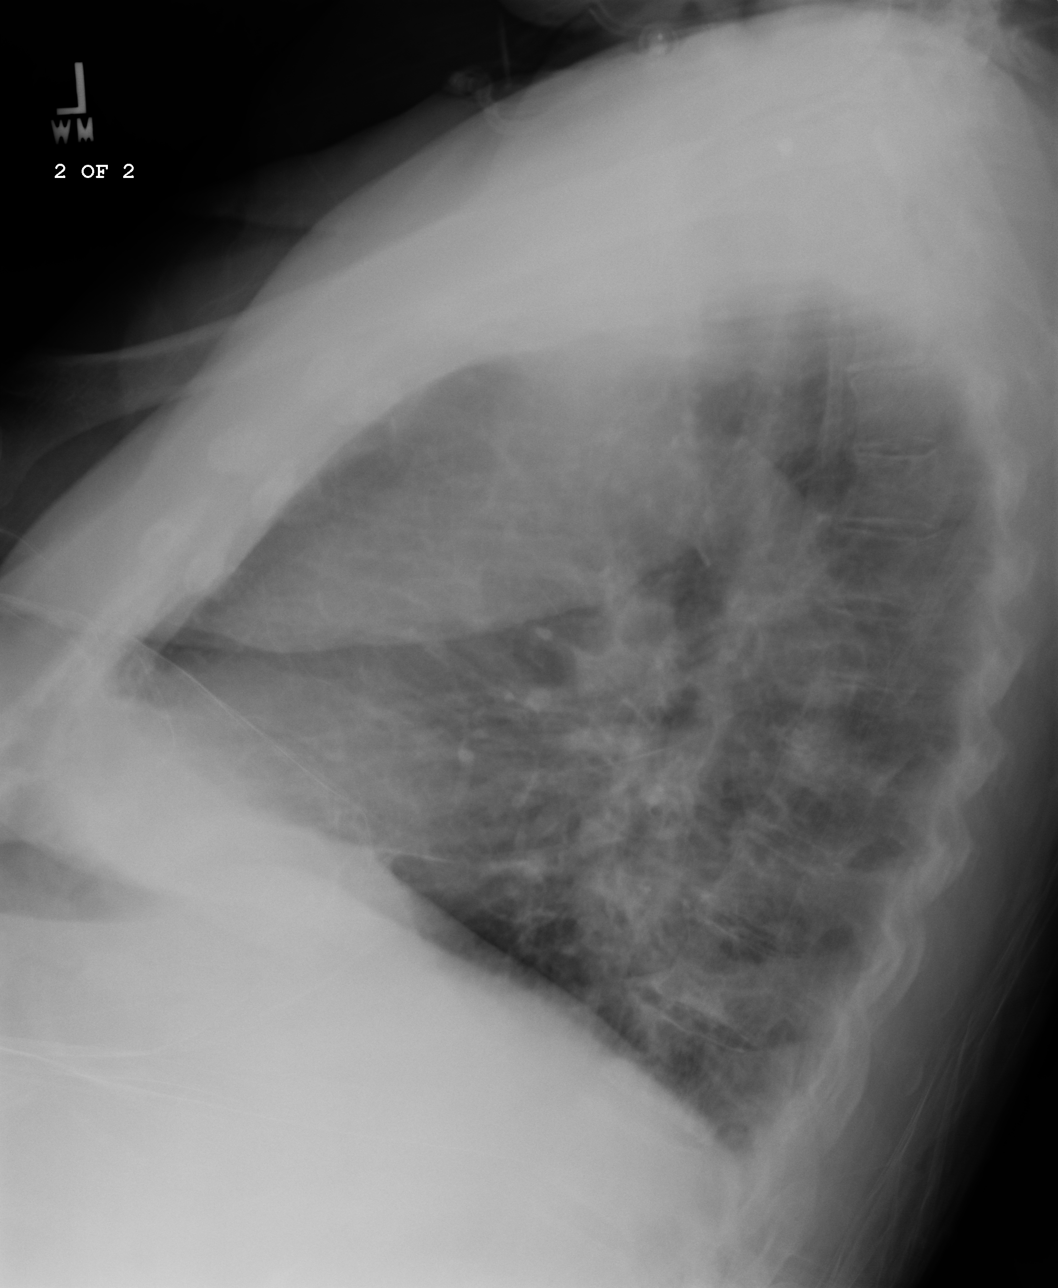

[3 of 3 positions shown; findings below may reference images not displayed]

PROCEDURE:     DXR - DXR CHEST PA (OR AP) AND LATERAL  - March 20, 2009 [DATE]

RESULT:     Comparison is made to the prior exam of 03/18/2009. The current
exam shows mild thickening of the basilar lung markings bilaterally. The
findings are minimal and may be secondary to the lack of deep inspiration.
Nonetheless, minimal interstitial pneumonitis or interstitial edema cannot
be excluded on the basis of this exam. The previously noted patchy density
at the right apex is no longer seen. Heart size is normal. Again noted is
marked deformity of the left humeral head.
IMPRESSION: 1. There is nonspecific thickening of the basilar lung markings bilaterally.
Continued follow-up is recommended if such is clinically indicated.
2. The patchy density in the right upper lobe previously observed is no
longer seen.
3. Heart size is normal.
4. There is marked deformity of the left humeral head.

## 2012-03-16 IMAGING — CR DG CHEST 2V
1 series · 2 of 2 positions shown · non-contrast
Comparison: none

REASON FOR EXAM: wheezing
COMMENTS:

[Series 1: view not recorded · 0.17mm/px · 2 of 2 slices shown]
[im 1/2]
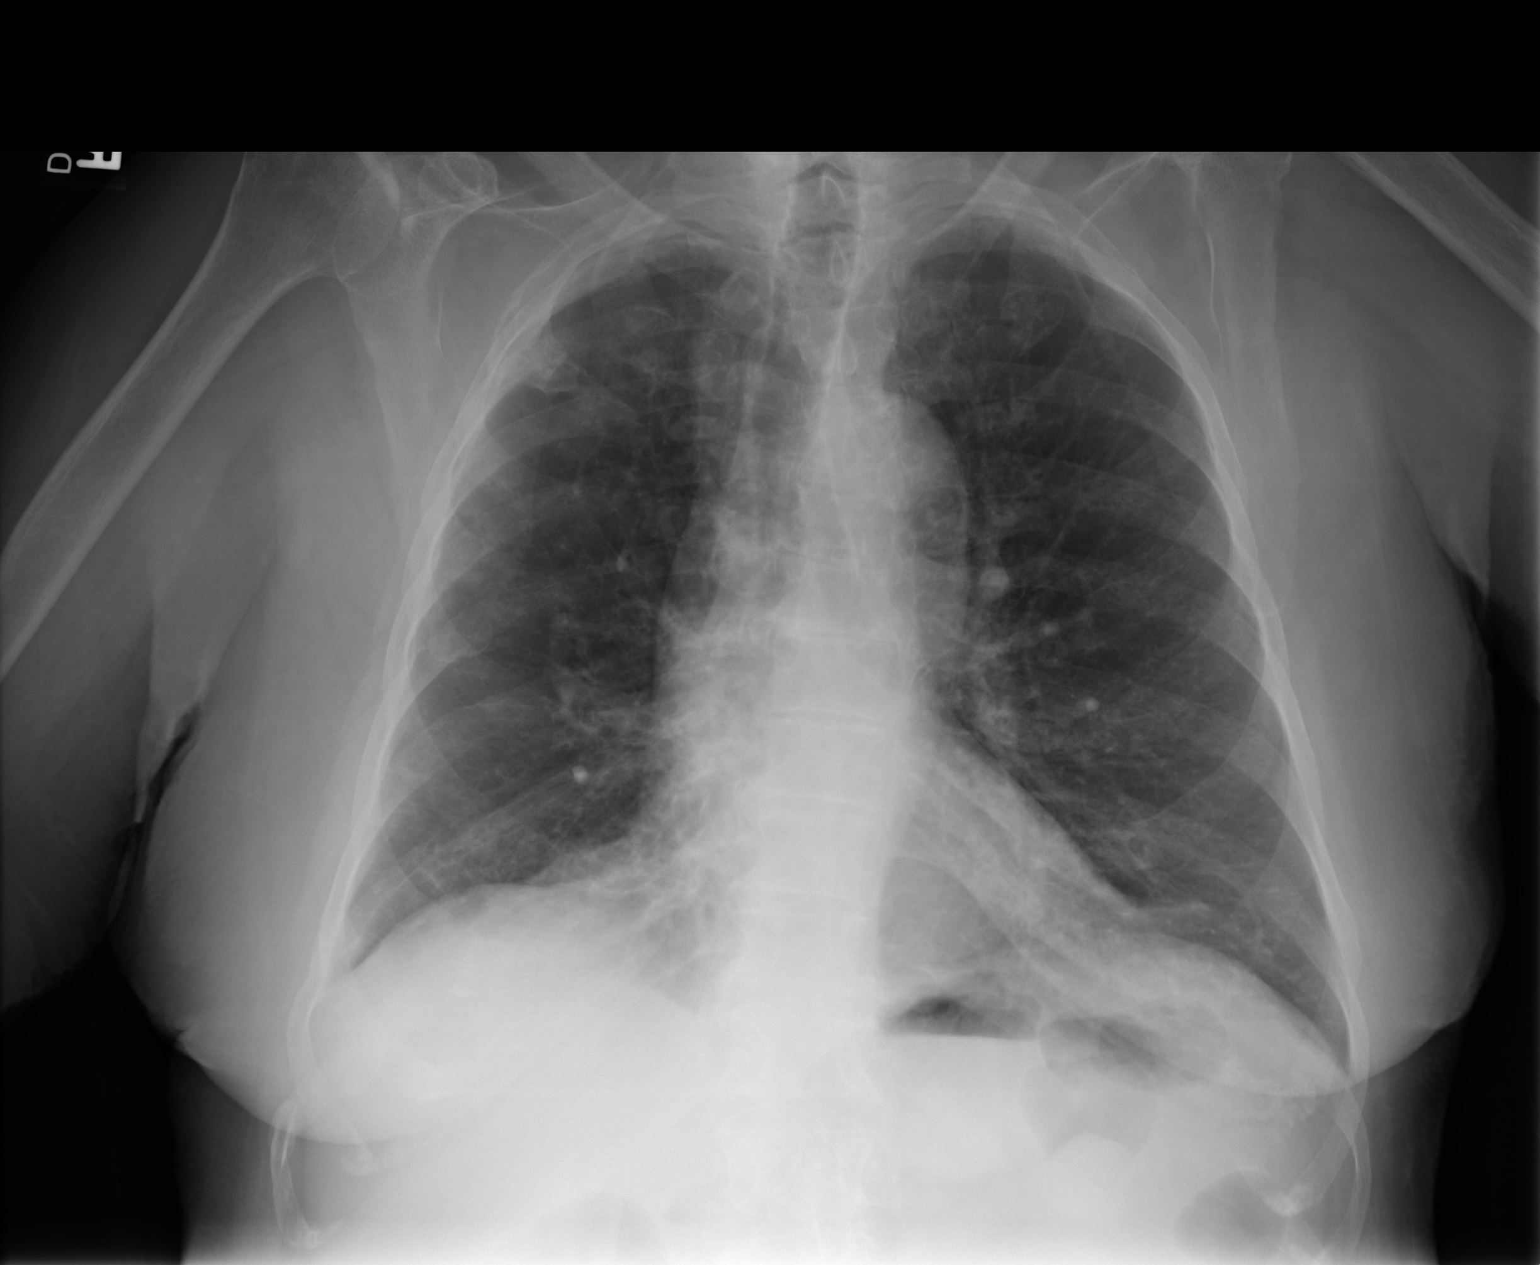
[im 2/2]
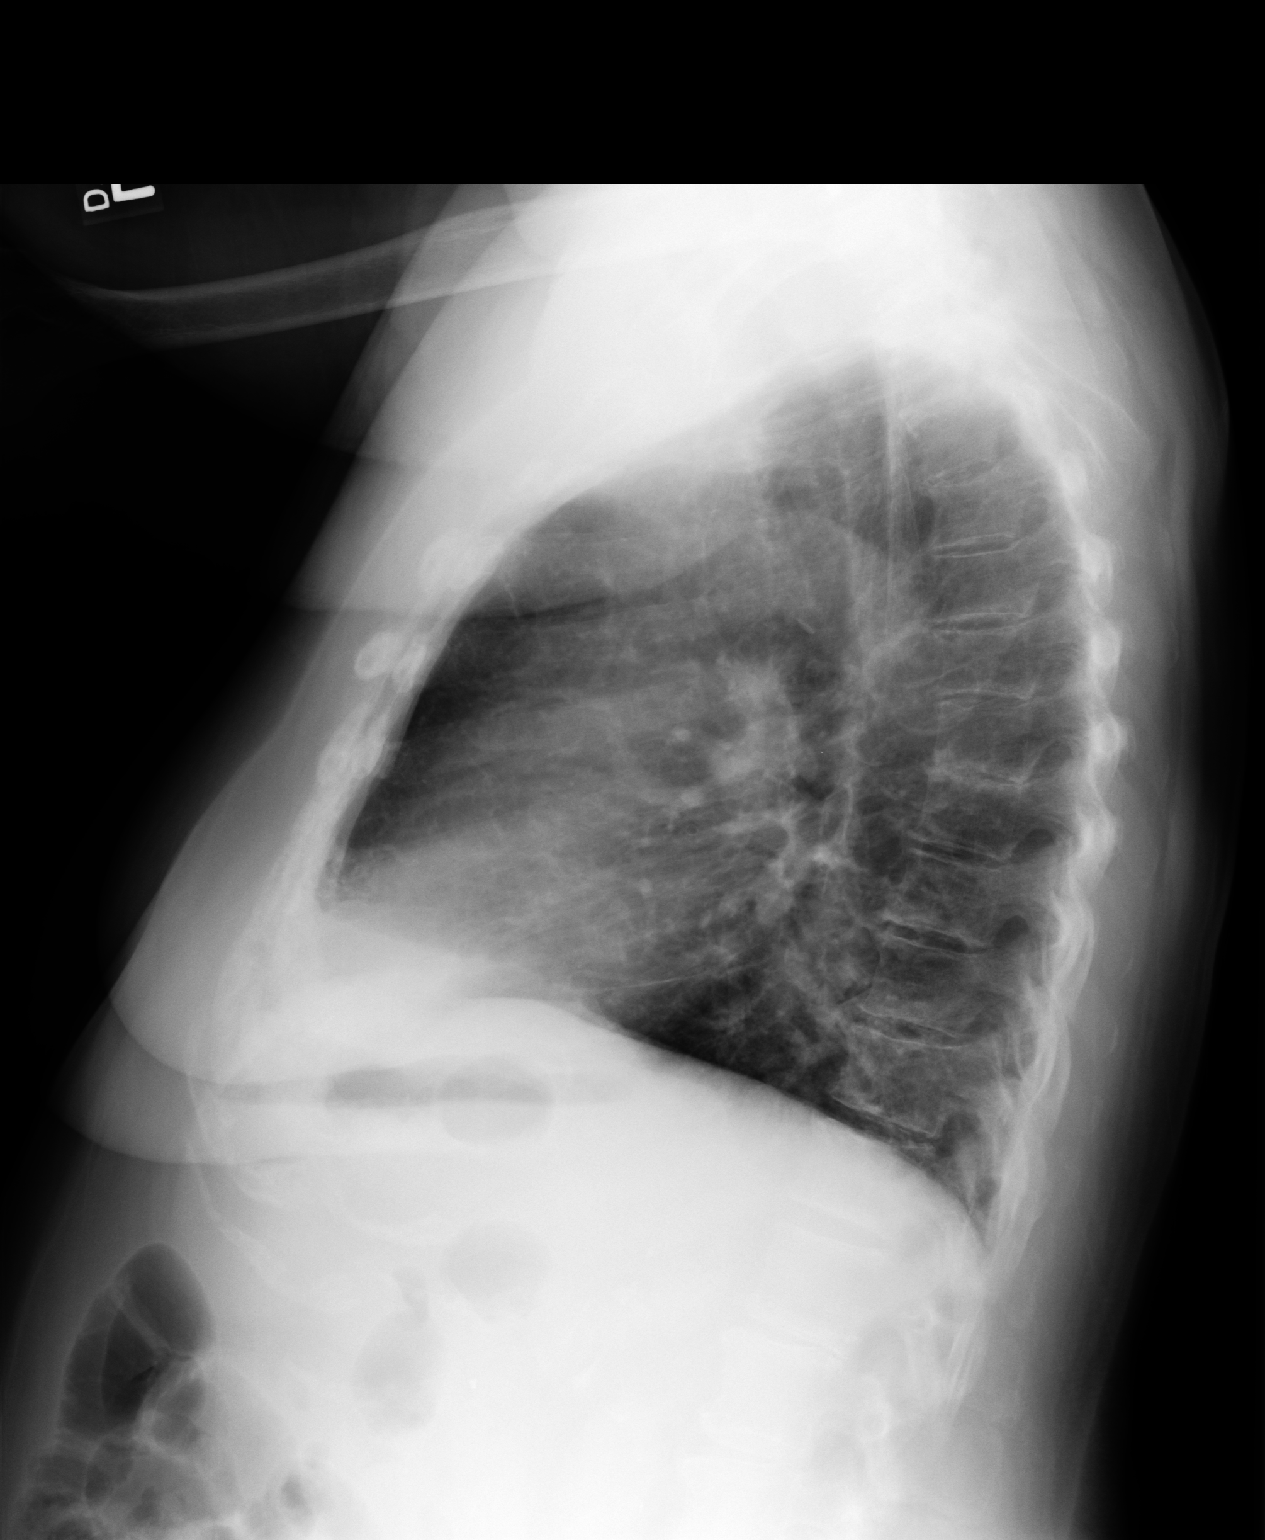

[2 of 2 positions shown; findings below may reference images not displayed]

PROCEDURE:     DXR - DXR CHEST PA (OR AP) AND LATERAL  - March 28, 2009  [DATE]

RESULT:     Comparison is made to the prior exam of 03/20/2009. The lung
fields are clear of infiltrate. The previously noted thickening of the lung
markings shows considerable improvement since the prior exam. No pleural
effusion or pulmonary edema is seen. No new pulmonary infiltrates are noted.
Heart size is normal. The chest appears mildly hyperexpanded.
IMPRESSION: 1. There is observed improvement in the previously noted thickening of the
lung markings bilaterally.
2. No new infiltrates are seen.
3. Heart size is normal.
4. The chest is mildly hyperexpanded.

## 2012-04-30 IMAGING — CR DG CHEST 1V PORT
1 series · 1 of 1 positions shown · non-contrast
Comparison: none

REASON FOR EXAM: CHEST PRESSURE
COMMENTS:

[view not recorded]
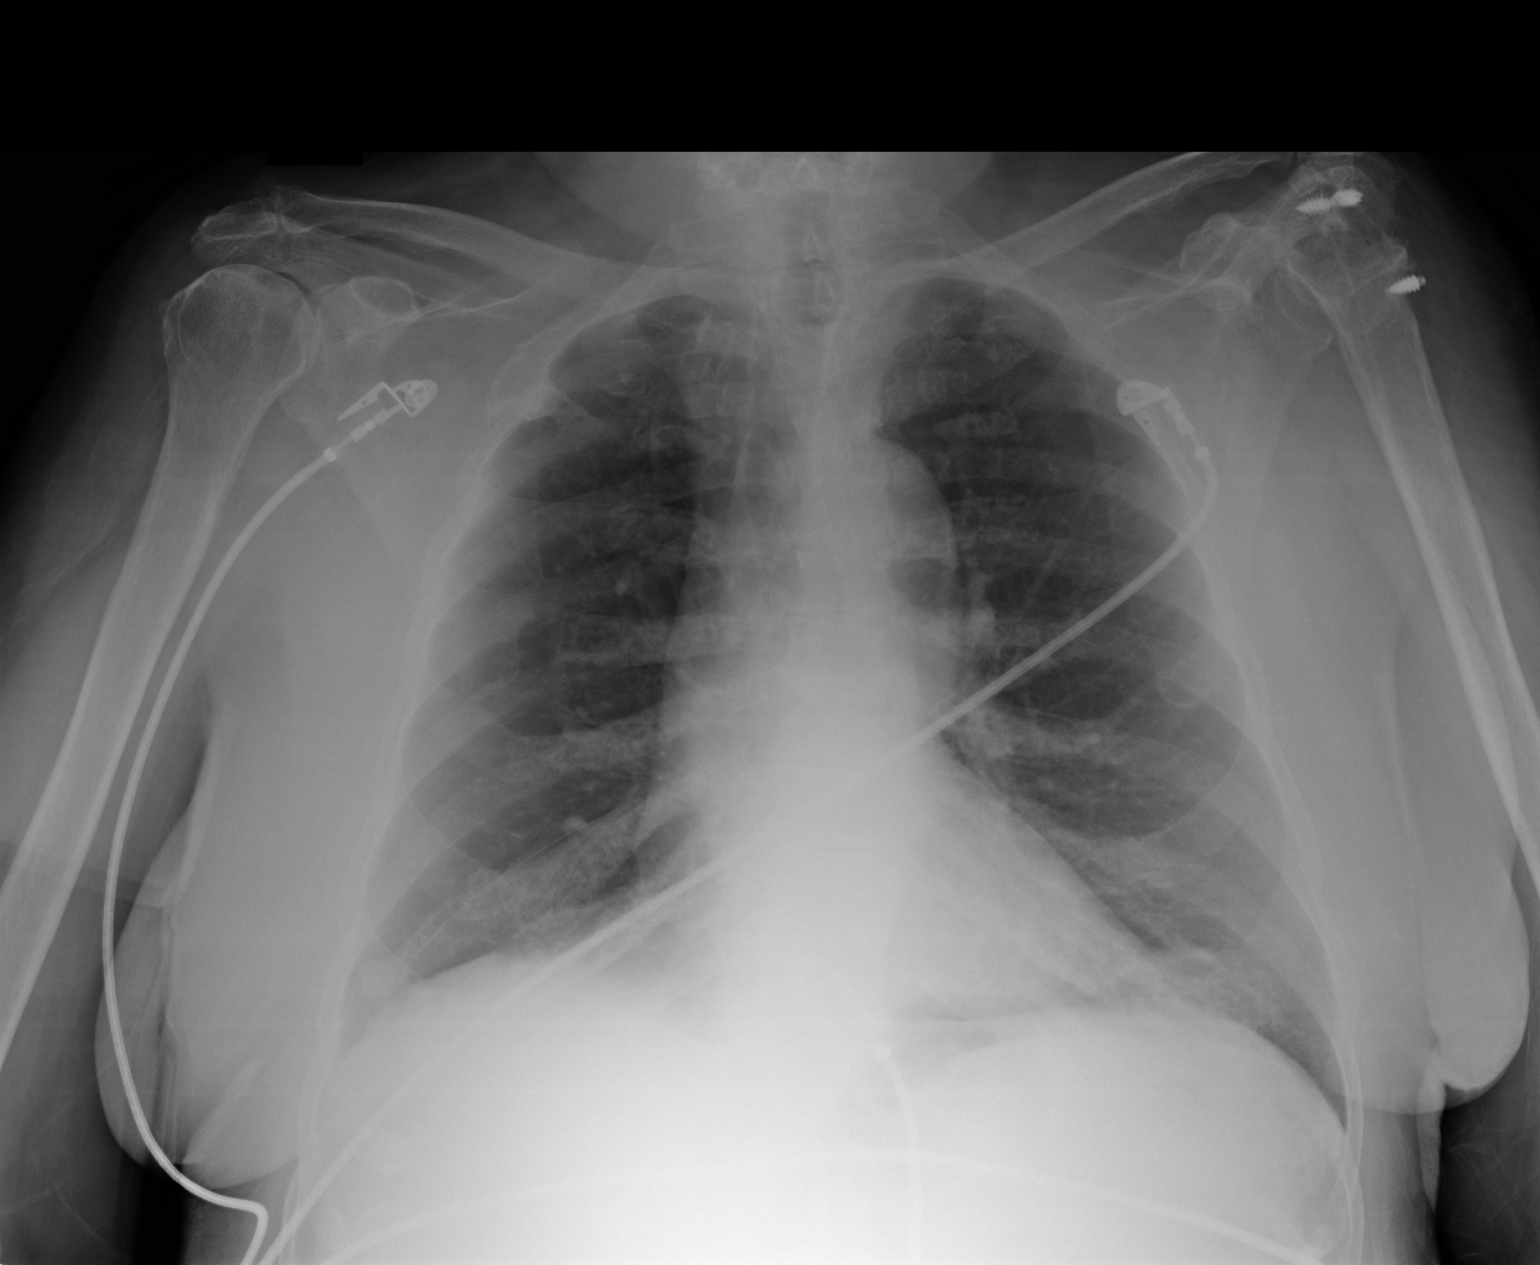

[1 of 1 positions shown; findings below may reference images not displayed]

PROCEDURE:     DXR - DXR PORTABLE CHEST SINGLE VIEW  - May 12, 2009 [DATE]

RESULT:     Comparison is made to the study 28 March, 2009.

The lungs are hyperinflated. The cardiac silhouette is enlarged. There is
minimal atelectasis at the left lung base. The cardiac silhouette is normal
in size. The pulmonary vascularity is not engorged. There is degenerative
change of the right shoulder and deformity of the left shoulder with loss of
much of the volume of the humeral head.
IMPRESSION: The findings are consistent with COPD. I cannot exclude
basilar atelectasis on the left. A followup PA and lateral chest x-ray would
be of value.

## 2012-05-15 IMAGING — CT CT CERVICAL SPINE WITHOUT CONTRAST
1 series · 12 of 14 positions shown, 15 images · non-contrast
Comparison: none

REASON FOR EXAM: sp fall with facial trauma
COMMENTS:

[Series 7: axial · axial · 0.33mm/px · z∈[-792,-667]mm · 12 of 75 slices shown, 15 images]
[im 6/75  soft-tissue]
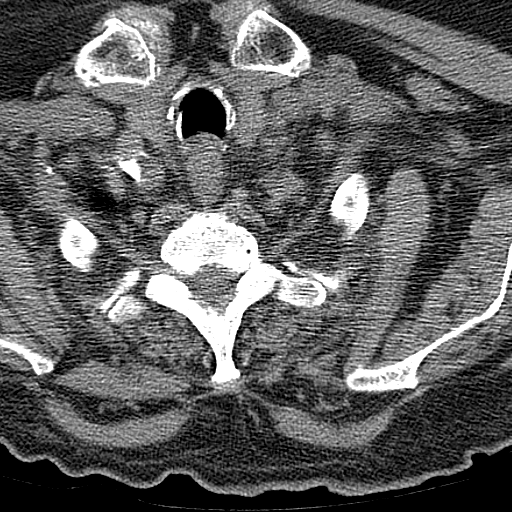
[im 6/75  bone]
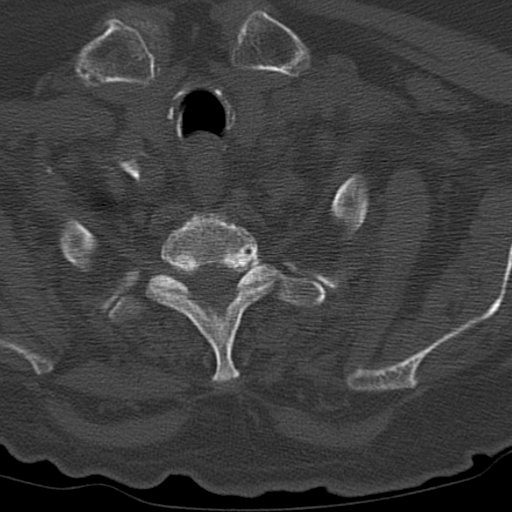
[im 12/75  bone]
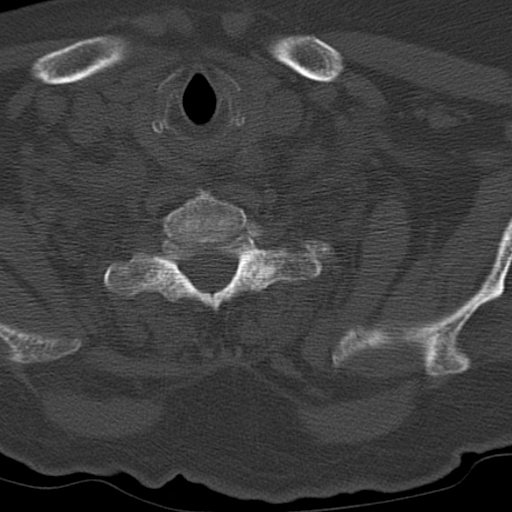
[im 18/75  bone]
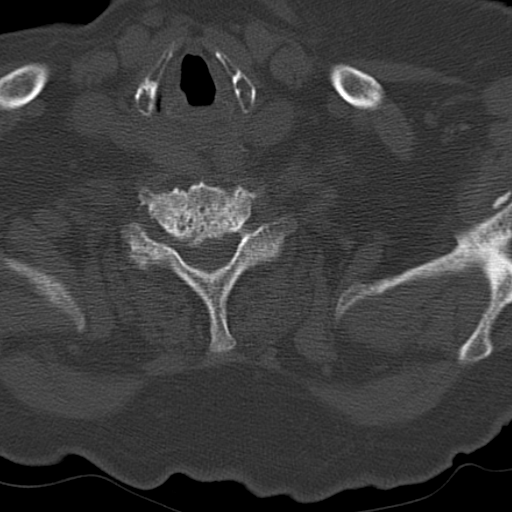
[im 23/75  bone]
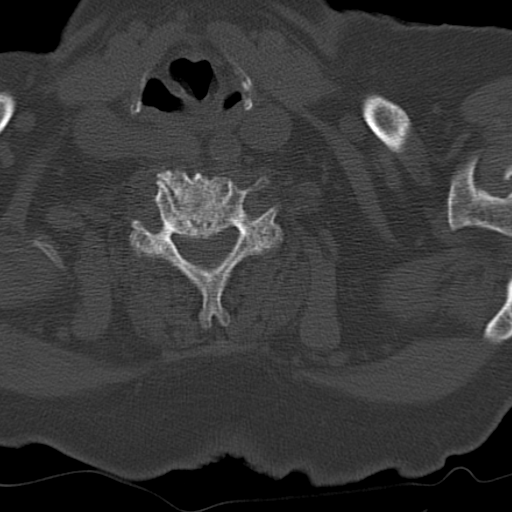
[im 29/75  soft-tissue]
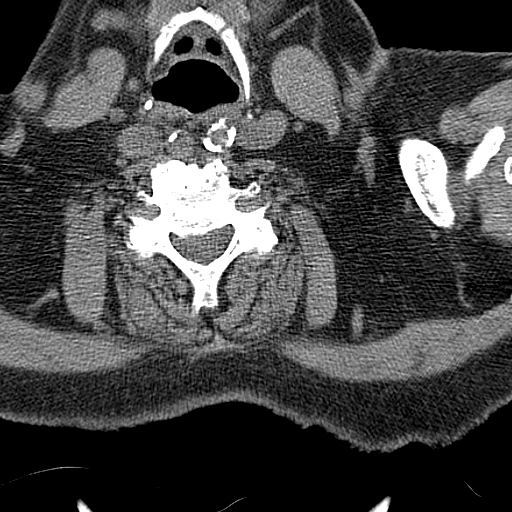
[im 29/75  bone]
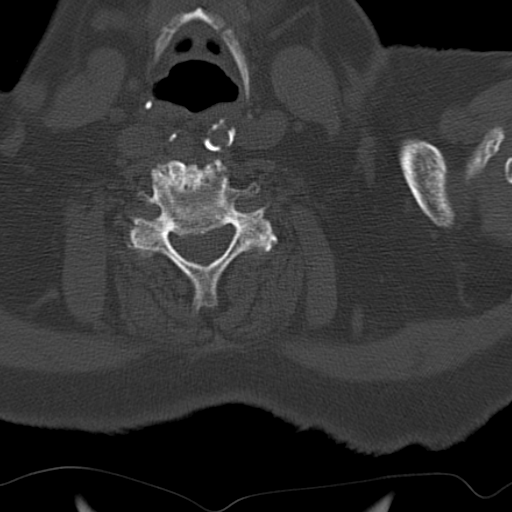
[im 35/75  bone]
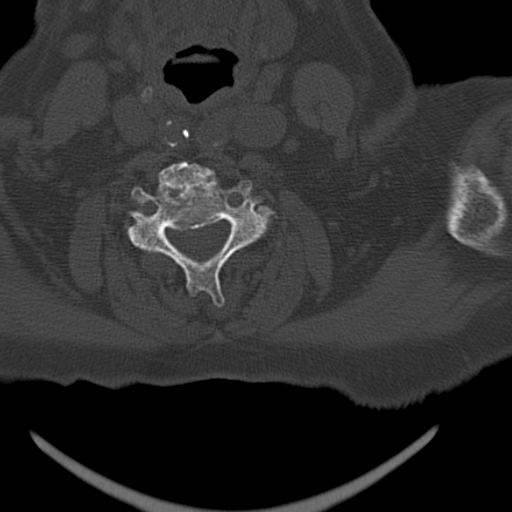
[im 40/75  bone]
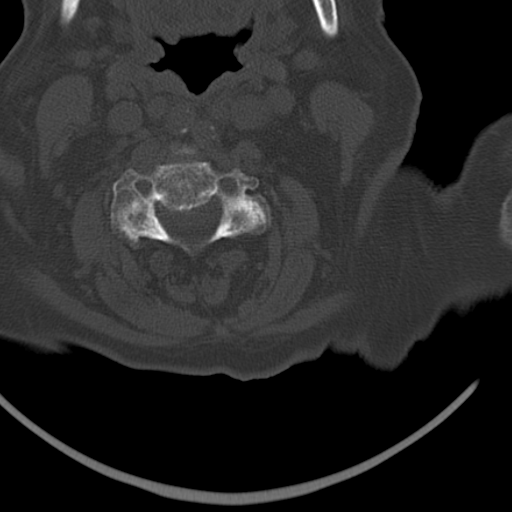
[im 46/75  bone]
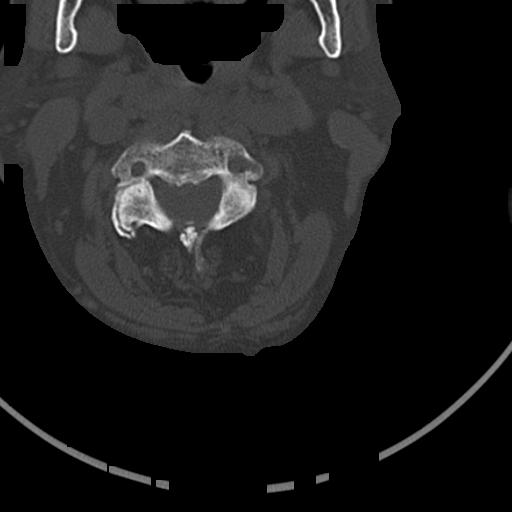
[im 52/75  soft-tissue]
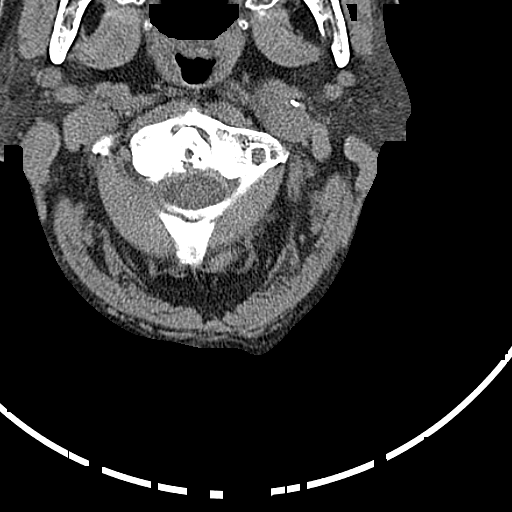
[im 52/75  bone]
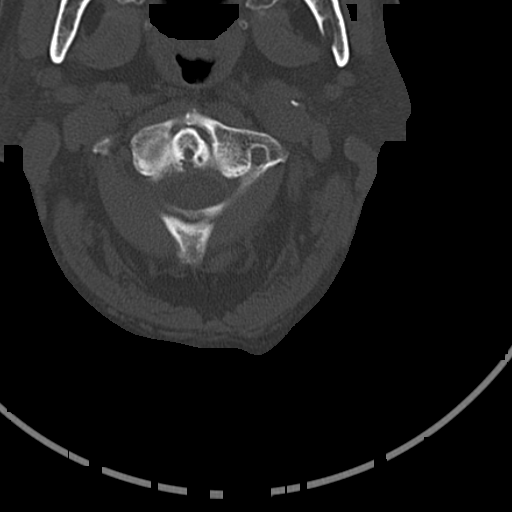
[im 57/75  bone]
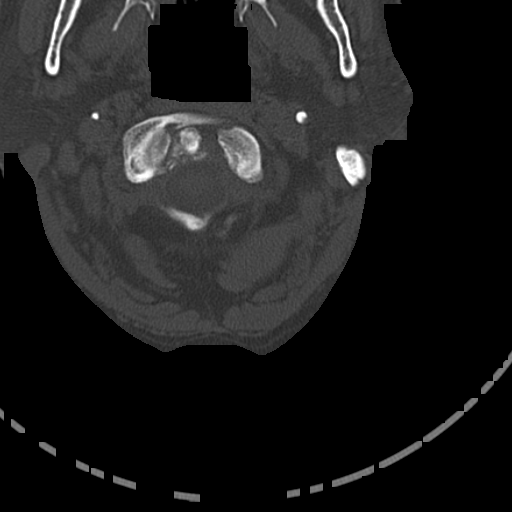
[im 63/75  bone]
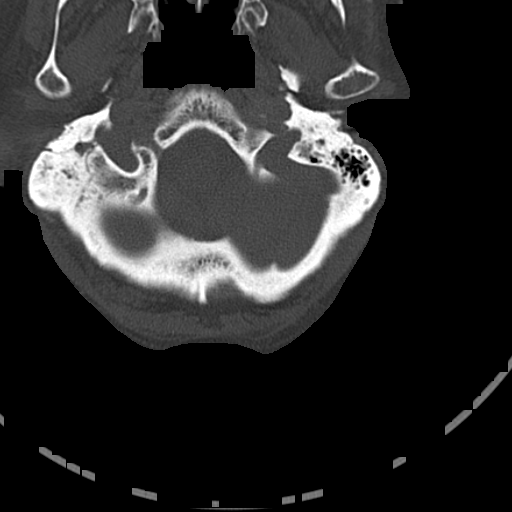
[im 69/75  bone]
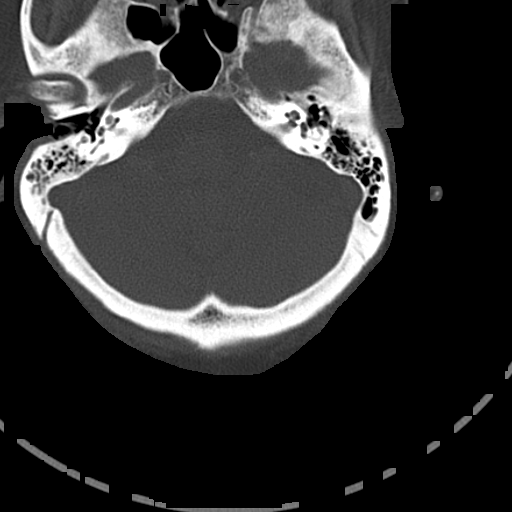

[12 of 14 positions shown; findings below may reference images not displayed]

PROCEDURE:     CT  - CT CERVICAL SPINE WO  - May 27, 2009  [DATE]

RESULT:     Sagittal, axial, and coronal images through the cervical spine
are reviewed.

There is reversal of the normal cervical lordosis. There is thickening of
the prevertebral soft tissues. There is degenerative disc space narrowing at
C3-C4, 4-5, 5-6, and 6-7. The bony ring at each cervical level is intact.
The spinous processes are intact. Degenerative facet joint change is
demonstrated at multiple levels. The odontoid is intact. The lateral masses
of C1 alignment normally with those of C2.
IMPRESSION: 1. I do not see evidence of an acute cervical spine fracture nor dislocation.
2. There is reversal of the normal cervical lordosis which may be related to
muscle spasm. There is thickening of the prevertebral soft tissues with some
calcific density in the soft tissues suggesting chronic processes. If there
remains strong clinical concerns of musculoskeletal injury, further
evaluation with MRI may be useful.
3. There is considerable degenerative change diffusely involving the
cervical spine.

A preliminary report was sent to the [HOSPITAL] the conclusion
of the study.

## 2012-05-15 IMAGING — CT CT HEAD WITHOUT CONTRAST
2 series · 15 of 30 positions shown, 19 images · non-contrast
Comparison: none

REASON FOR EXAM: facial trauma (nasal bridge) sp fall, anticoagulated
COMMENTS:

[Series 2: without · axial · non-contrast · 0.44mm/px · z∈[-676,-551]mm · 13 of 31 slices shown, 17 images]
[im 3/31  brain]
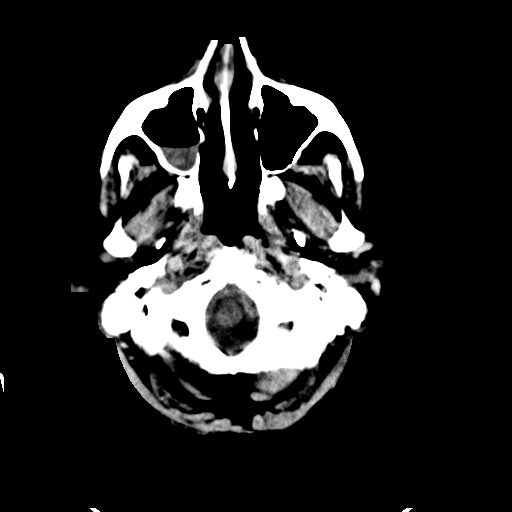
[im 3/31  bone]
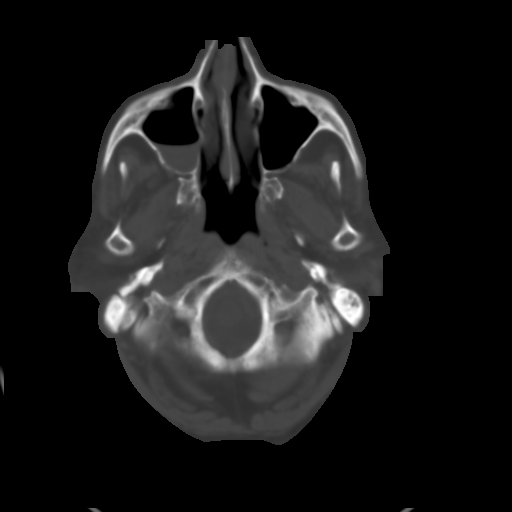
[im 5/31  brain]
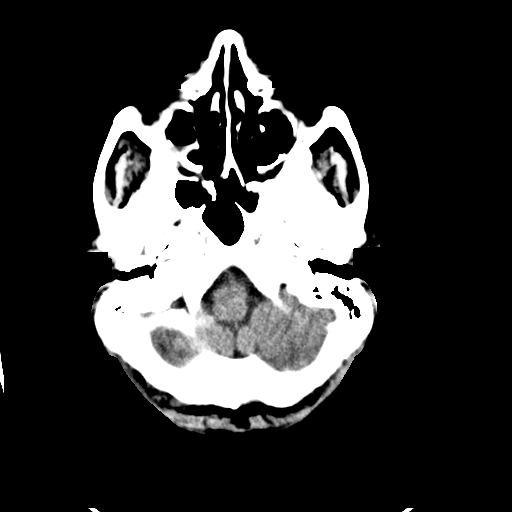
[im 7/31  brain]
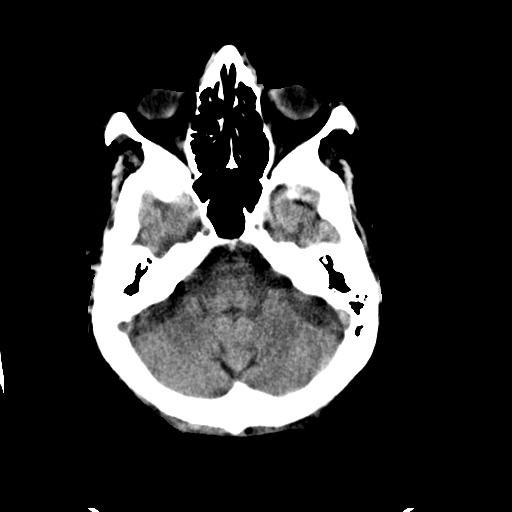
[im 9/31  brain]
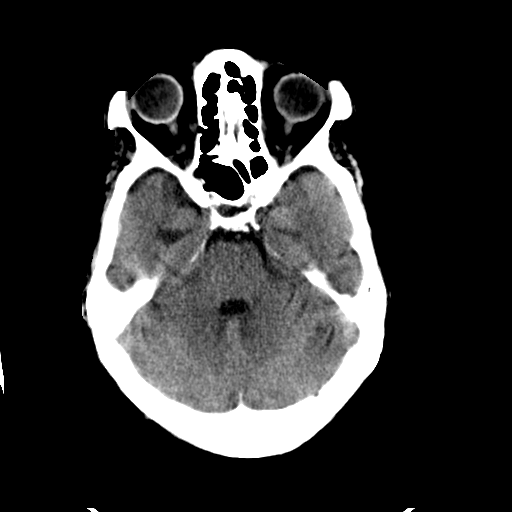
[im 11/31  brain]
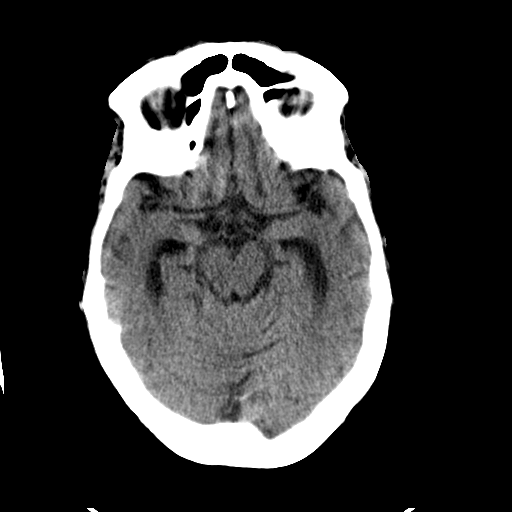
[im 11/31  bone]
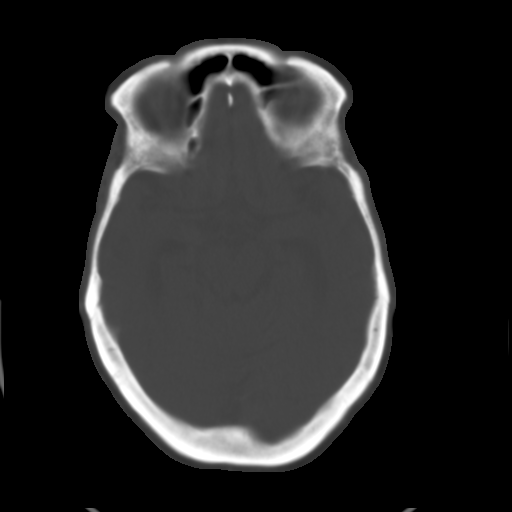
[im 13/31  brain]
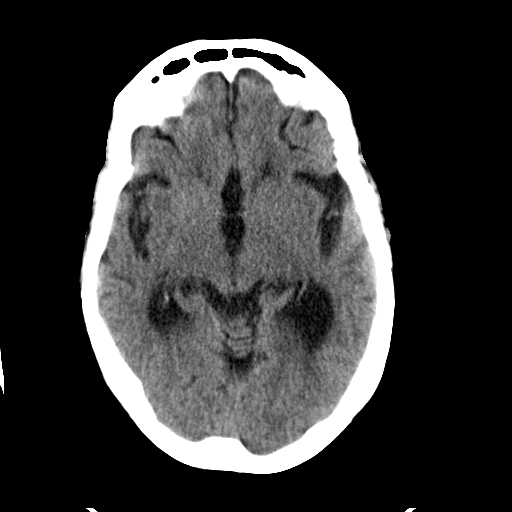
[im 16/31  brain]
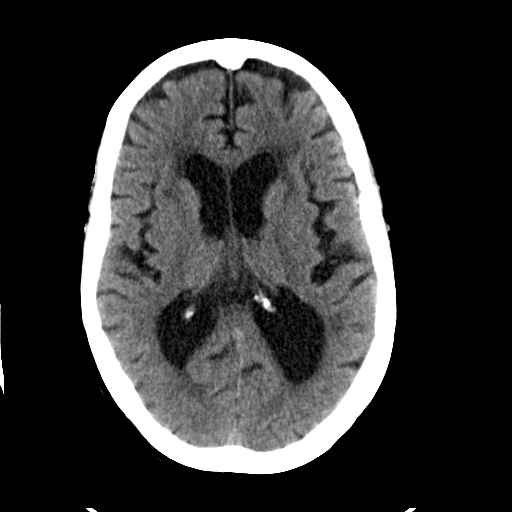
[im 18/31  brain]
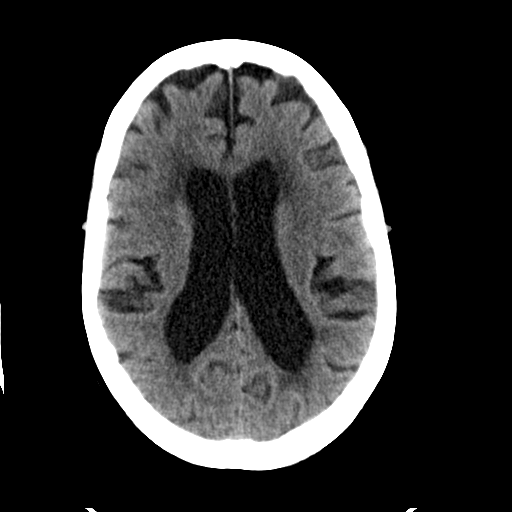
[im 20/31  brain]
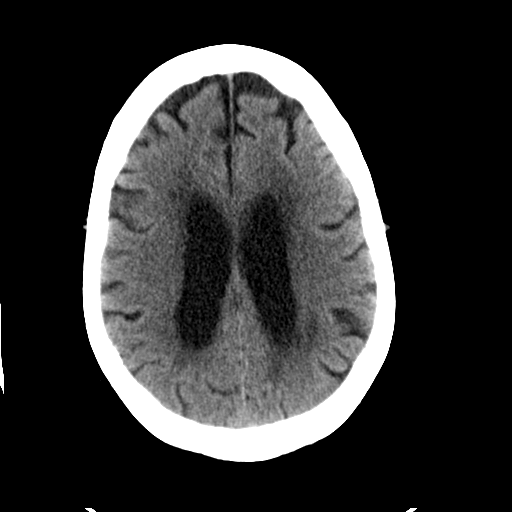
[im 20/31  bone]
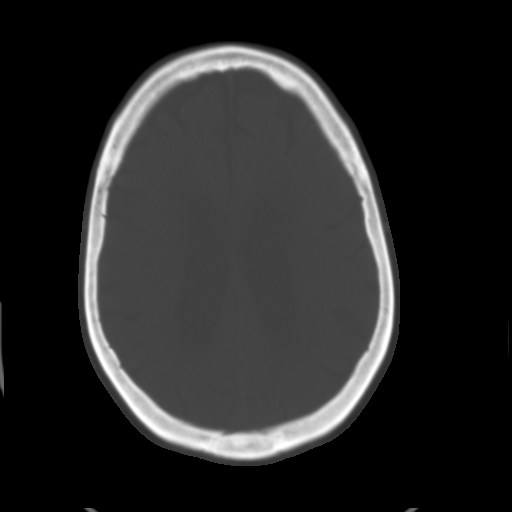
[im 22/31  brain]
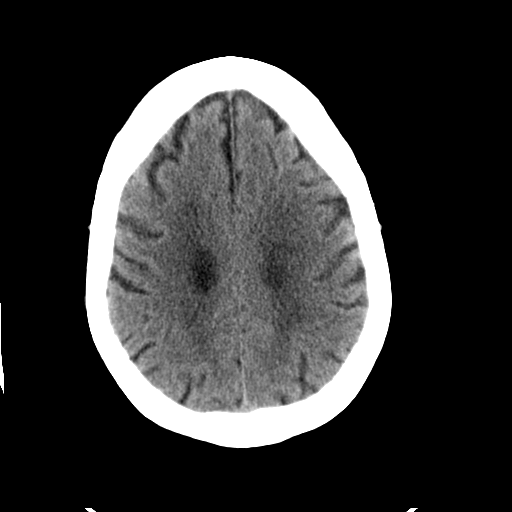
[im 24/31  brain]
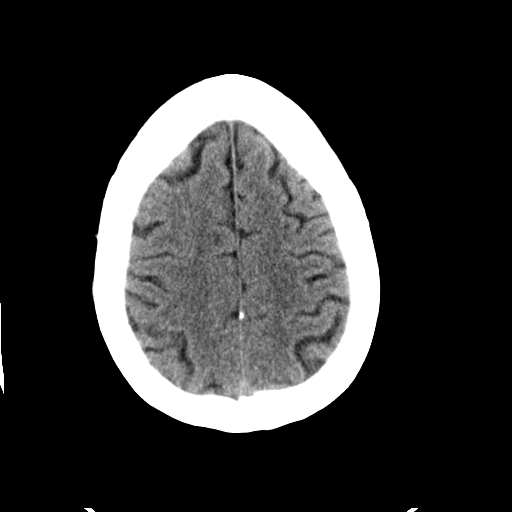
[im 26/31  brain]
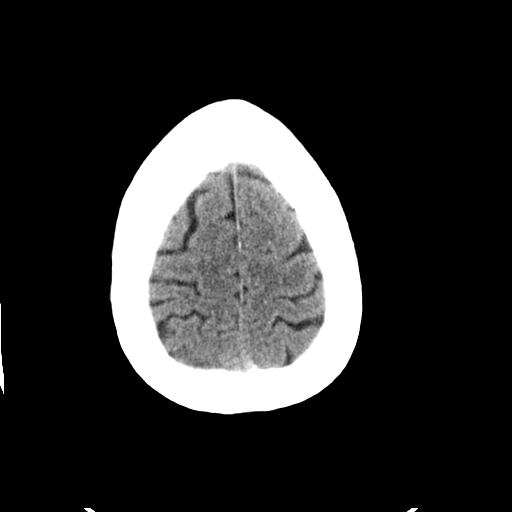
[im 28/31  brain]
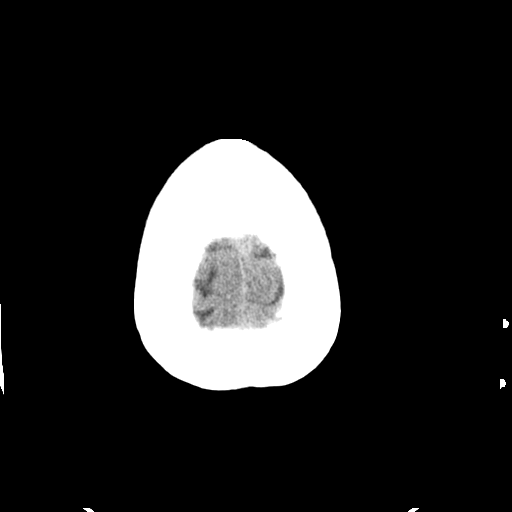
[im 28/31  bone]
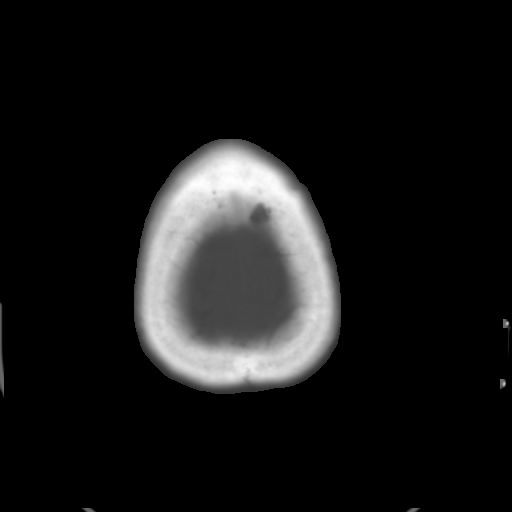

[Series 3: bone · axial · 0.44mm/px · z∈[-676,-656]mm · 2 of 31 slices shown]
[im 3/31  bone]
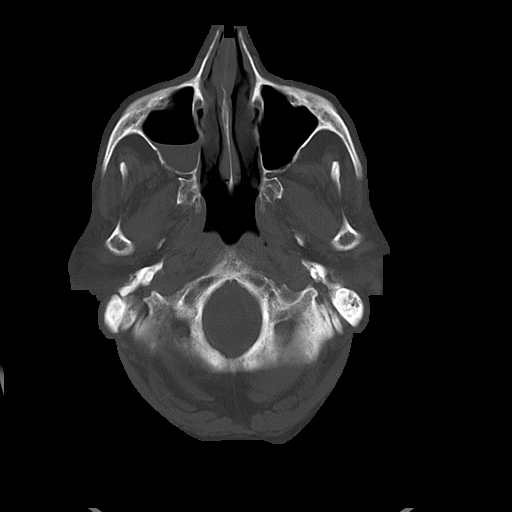
[im 7/31  bone]
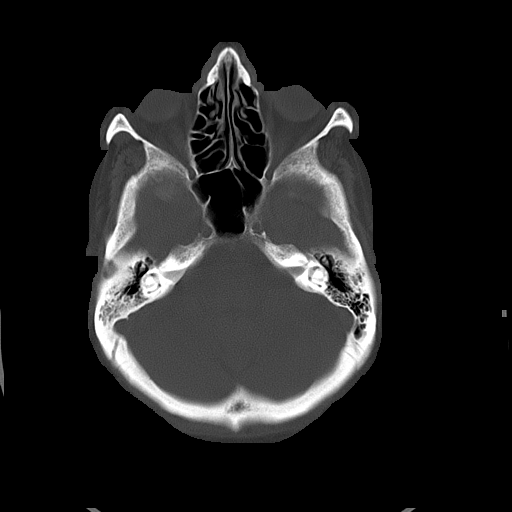

[15 of 30 positions shown; findings below may reference images not displayed]

PROCEDURE:     CT  - CT HEAD WITHOUT CONTRAST  - May 27, 2009  [DATE]

RESULT:     Axial CT scanning was performed through the brain at 5 mm
intervals and slice thicknesses.

There is mild diffuse age-appropriate cerebral and cerebellar atrophy. There
is mild compensatory ventriculomegaly. There is no intracranial hemorrhage
nor intracranial mass effect. There is no evidence of an evolving ischemic
event. Minimally decreased density in the deep white matter of both cerebral
hemispheres is consistent with chronic small vessel ischemic type change.

There is an air-fluid level in the right maxillary sinus and there is
apparently disruption of the anterior wall of the maxillary sinus. This is
evaluated more completely on an accompanying facial bone CT. There is soft
tissue density material present in the the portion of the right external
auditory canal. The mastoid air cells on the right are not as well
pneumatized as those on the left.
IMPRESSION: 1. I do not see evidence of an acute intracranial hemorrhage.
2. There are mild age-appropriate atrophic changes present.
3. I do not see evidence of an acute skull fracture. There is a fracture
involving the anterior wall of the right maxillary sinus and an air-fluid
level is present.

A preliminary report was sent to the [[facility]OS[facility]] the conclusion
of the study.

## 2012-05-15 IMAGING — CT CT MAXILLOFACIAL WITHOUT CONTRAST
1 series · 15 of 30 positions shown, 19 images · non-contrast
Comparison: none

REASON FOR EXAM: nasal bridge trauma
COMMENTS:

[Series 3: facial 3.0 h60f · axial · 0.29mm/px · z∈[-760,-613]mm · 15 of 53 slices shown, 19 images]
[im 2/53  brain]
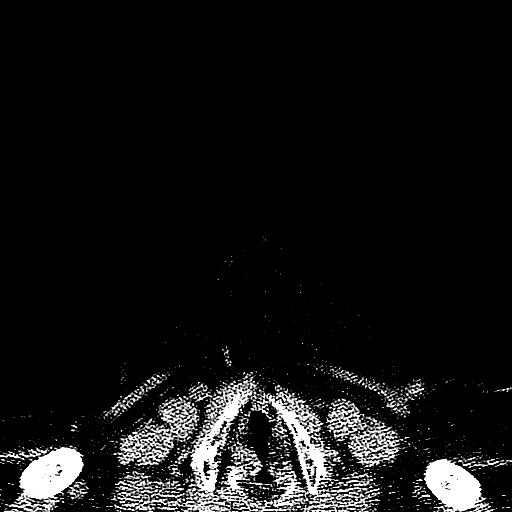
[im 2/53  bone]
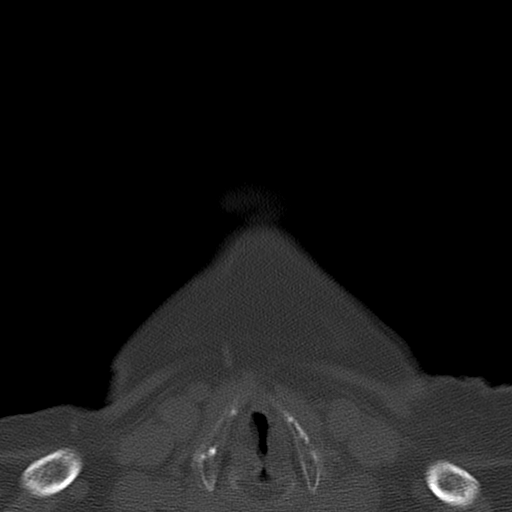
[im 6/53  bone]
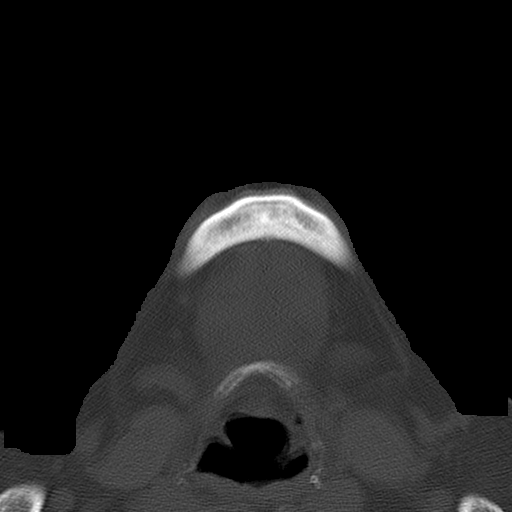
[im 9/53  bone]
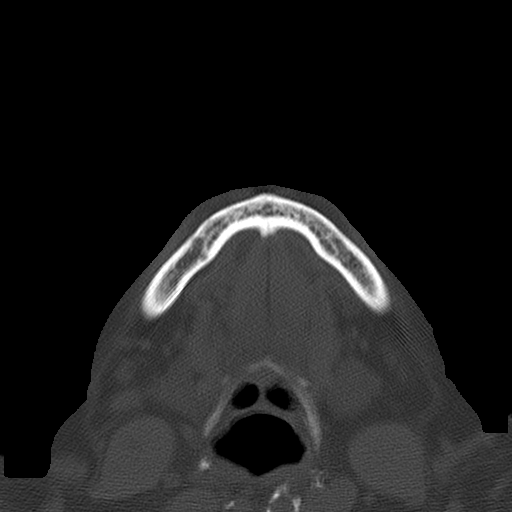
[im 13/53  bone]
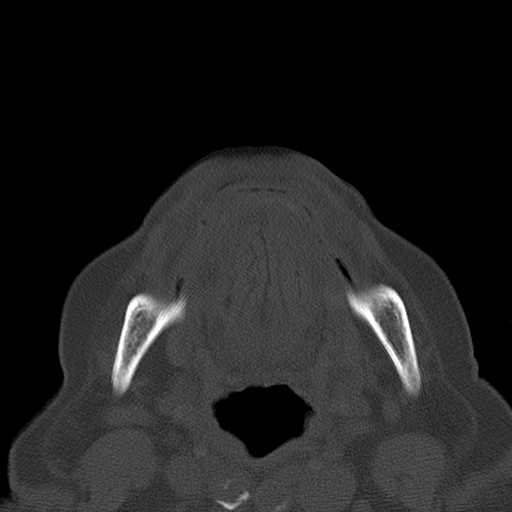
[im 17/53  brain]
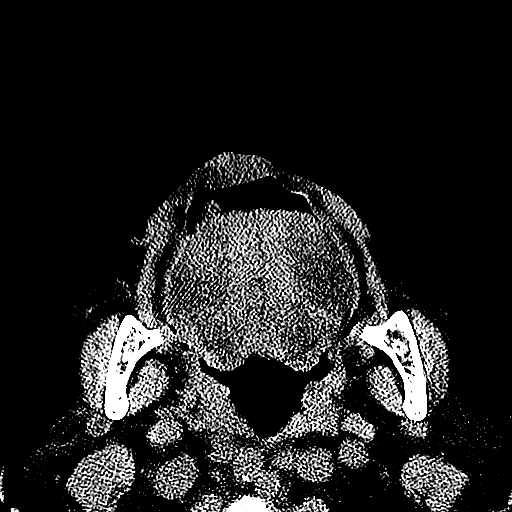
[im 17/53  bone]
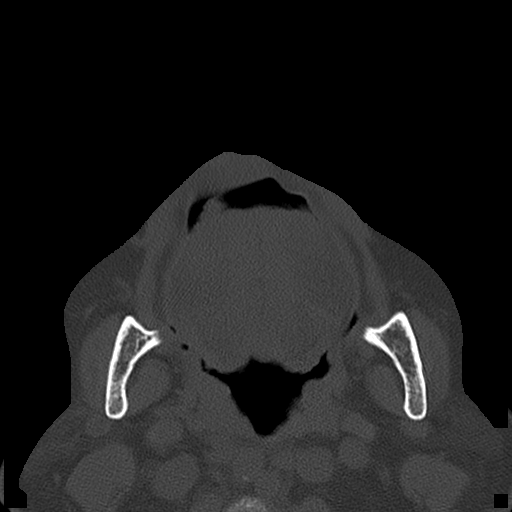
[im 20/53  bone]
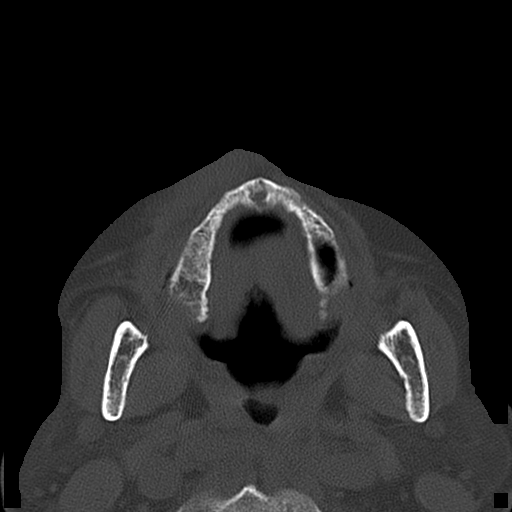
[im 24/53  bone]
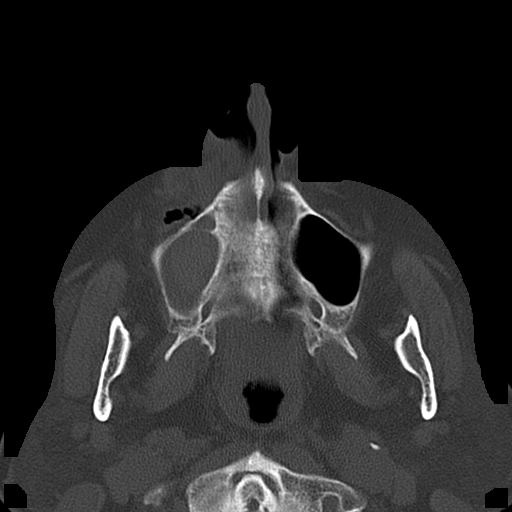
[im 27/53  bone]
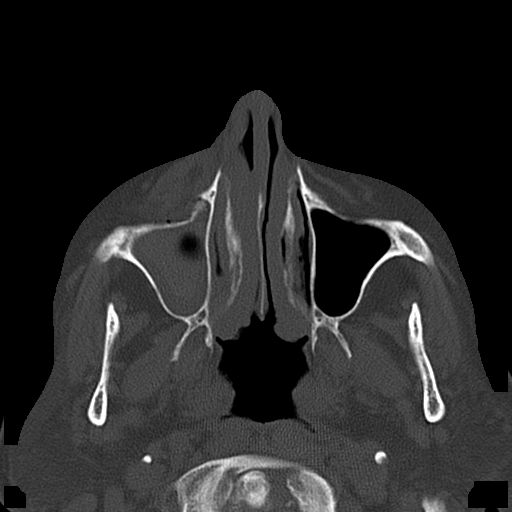
[im 29/53  brain]
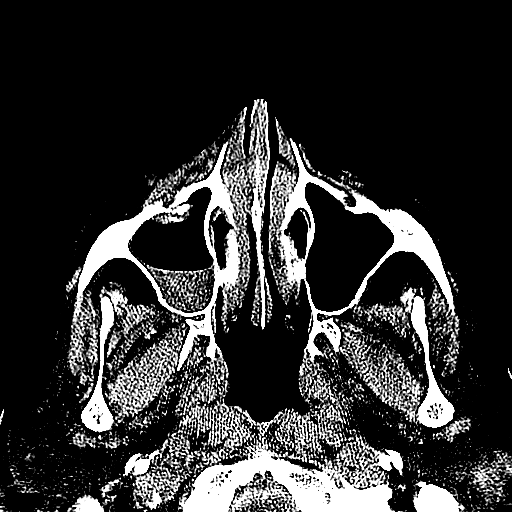
[im 29/53  bone]
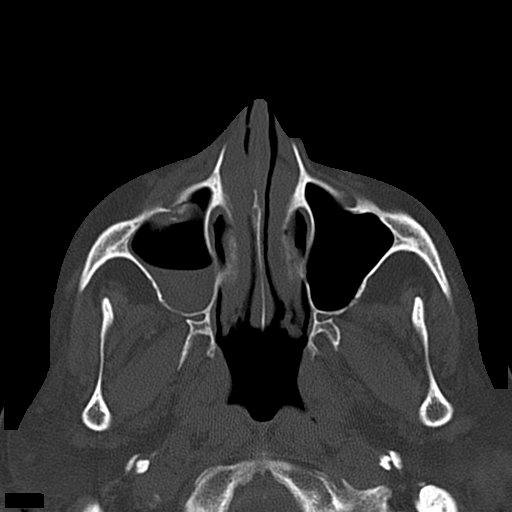
[im 33/53  bone]
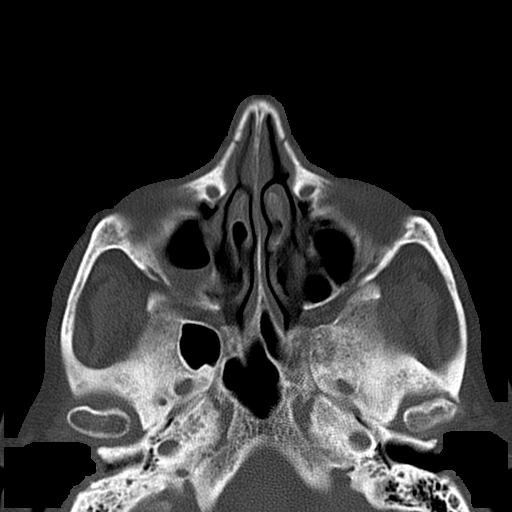
[im 36/53  bone]
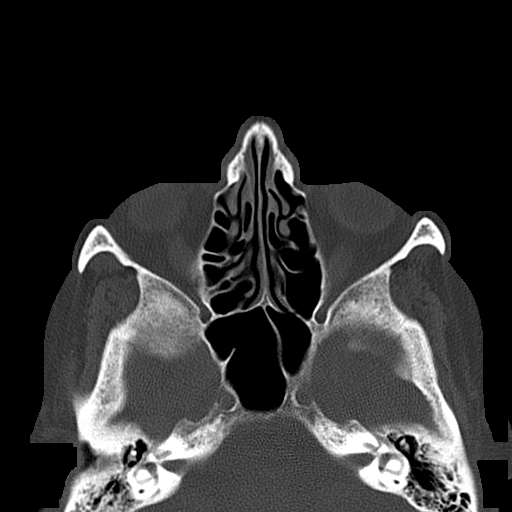
[im 40/53  bone]
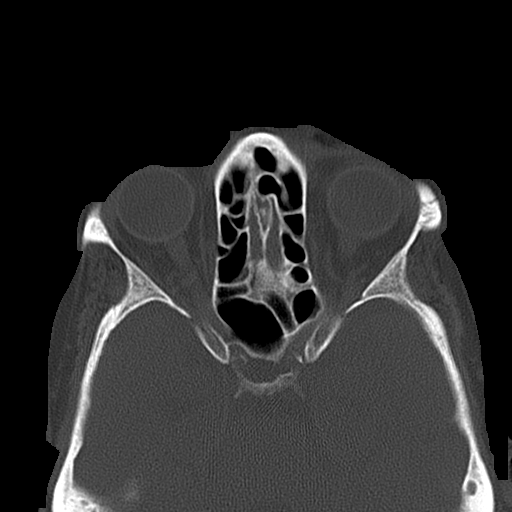
[im 44/53  brain]
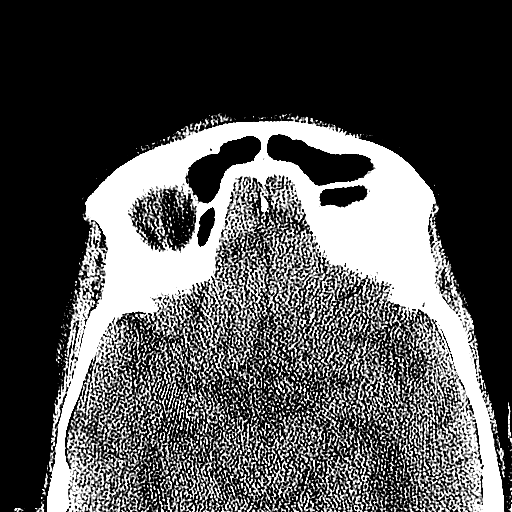
[im 44/53  bone]
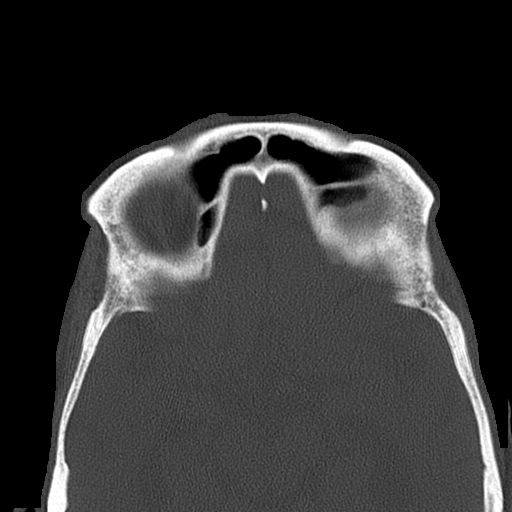
[im 47/53  bone]
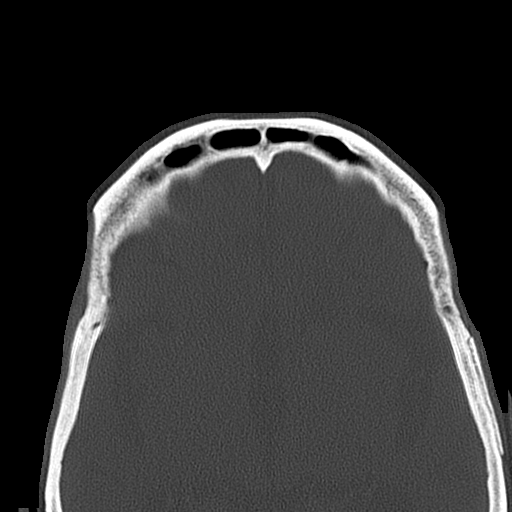
[im 51/53  bone]
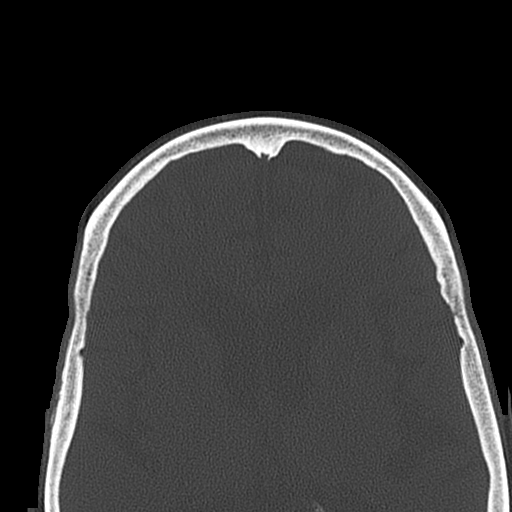

[15 of 30 positions shown; findings below may reference images not displayed]

PROCEDURE:     CT  - CT MAXILLOFACIAL AREA WO  - May 27, 2009  [DATE]

RESULT:     Axial CT scanning was performed through the facial bones using a
bone algorithm. Coronal reconstructions were obtained. There is abnormal
soft tissue density material within the right maxillary sinus. There is an
air-fluid level here. There is depression of the anterior/inferior wall of
the right maxillary sinus. There is edema of the overlying soft tissues. The
medial and lateral walls of the right maxillary sinus are intact. There is
no evidence of an orbital floor fracture. The nasal septum is intact. I do
not see evidence of an acute nasal bone fracture. The zygomatic arches are
intact. The temporomandibular joints reveal age-appropriate degenerative
change especially on the left.
IMPRESSION: 1. There is a depressed fracture of the anterior/inferior wall of the right
maxillary sinus. There is an air-fluid level. The degree of depression is
approximately 5 mm maximally. There is overlying soft tissue swelling and
there is air in the soft tissues here.
2. The nasal bone and the orbital bones are intact.
3. I do not see facial bone fractures elsewhere.

A preliminary report was sent to the [[facility]OS[facility]] the conclusion
of the study.

## 2012-08-18 IMAGING — CR DG HUMERUS 2V *L*
1 series · 2 of 2 positions shown · non-contrast
Comparison: none

REASON FOR EXAM: pain
COMMENTS:

PROCEDURE:     DXR - DXR HUMERUS LEFT  - August 30, 2009  [DATE]
RESULT:     Comparison: None.

[Series 1: view not recorded · 0.17mm/px · 2 of 2 slices shown]
[im 1/2]
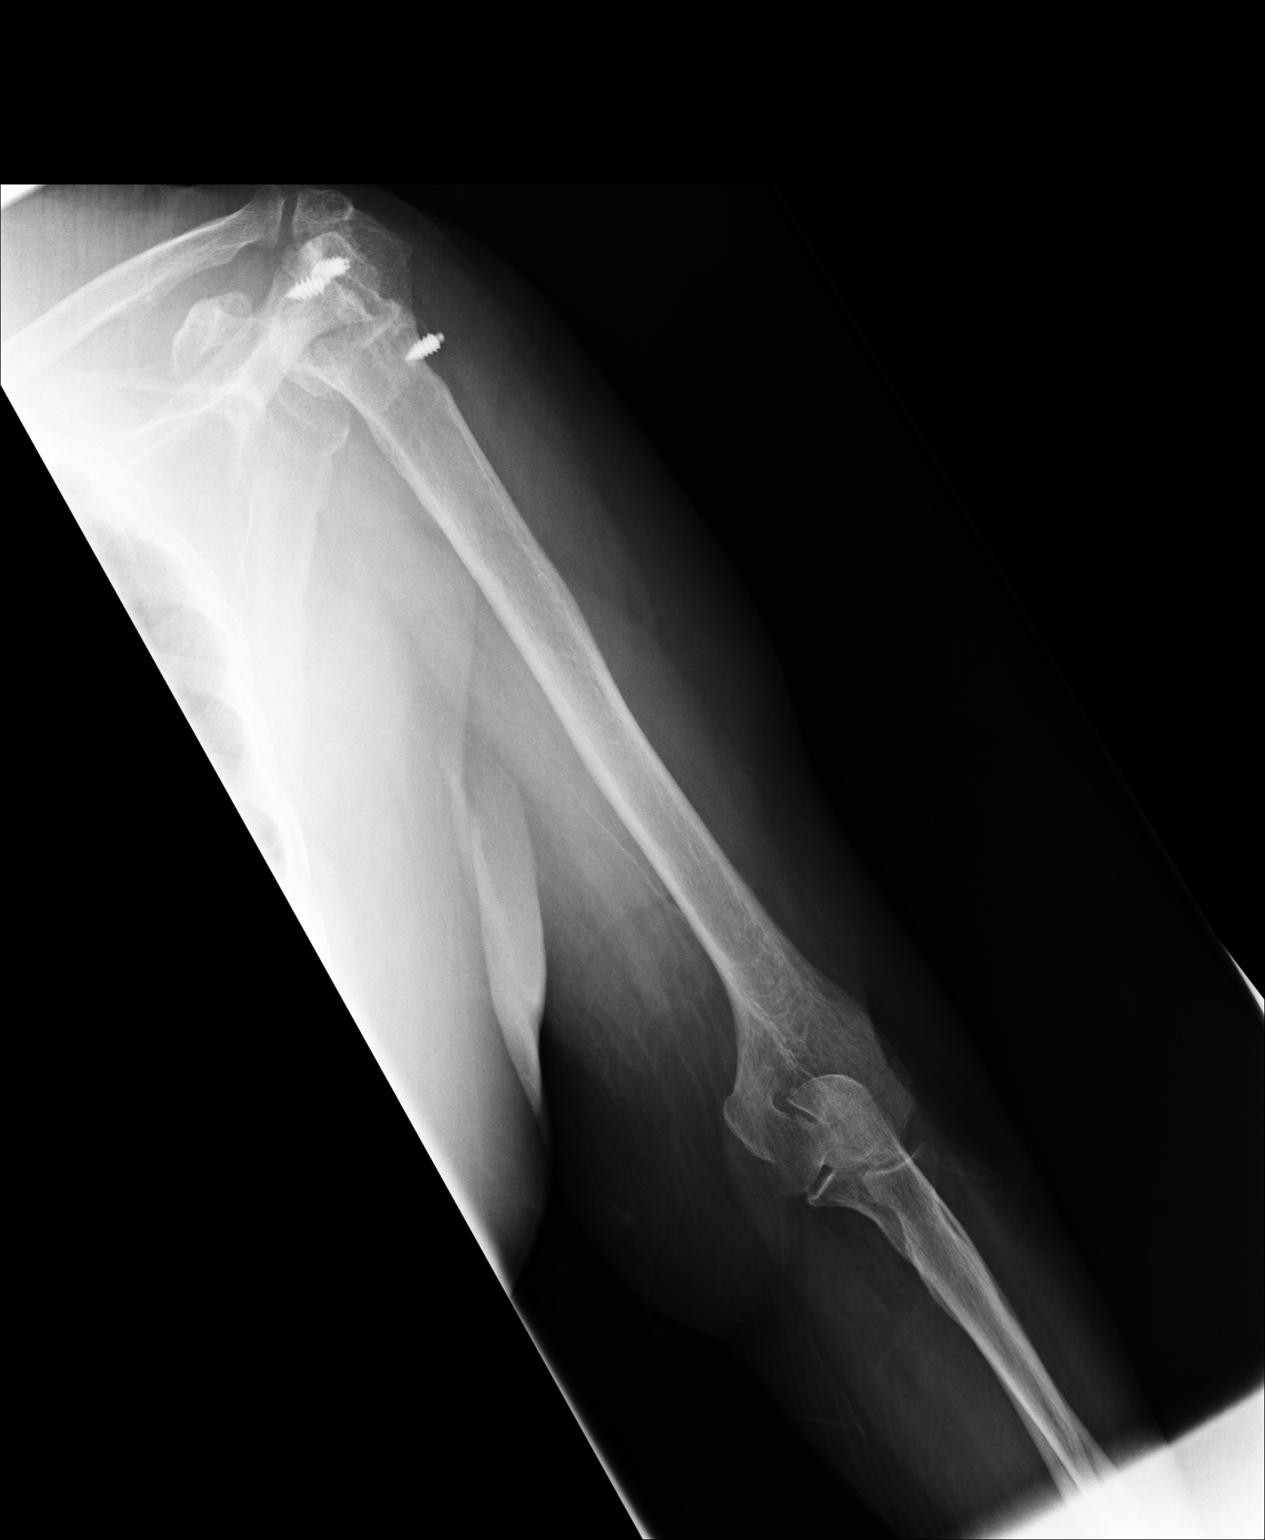
[im 2/2]
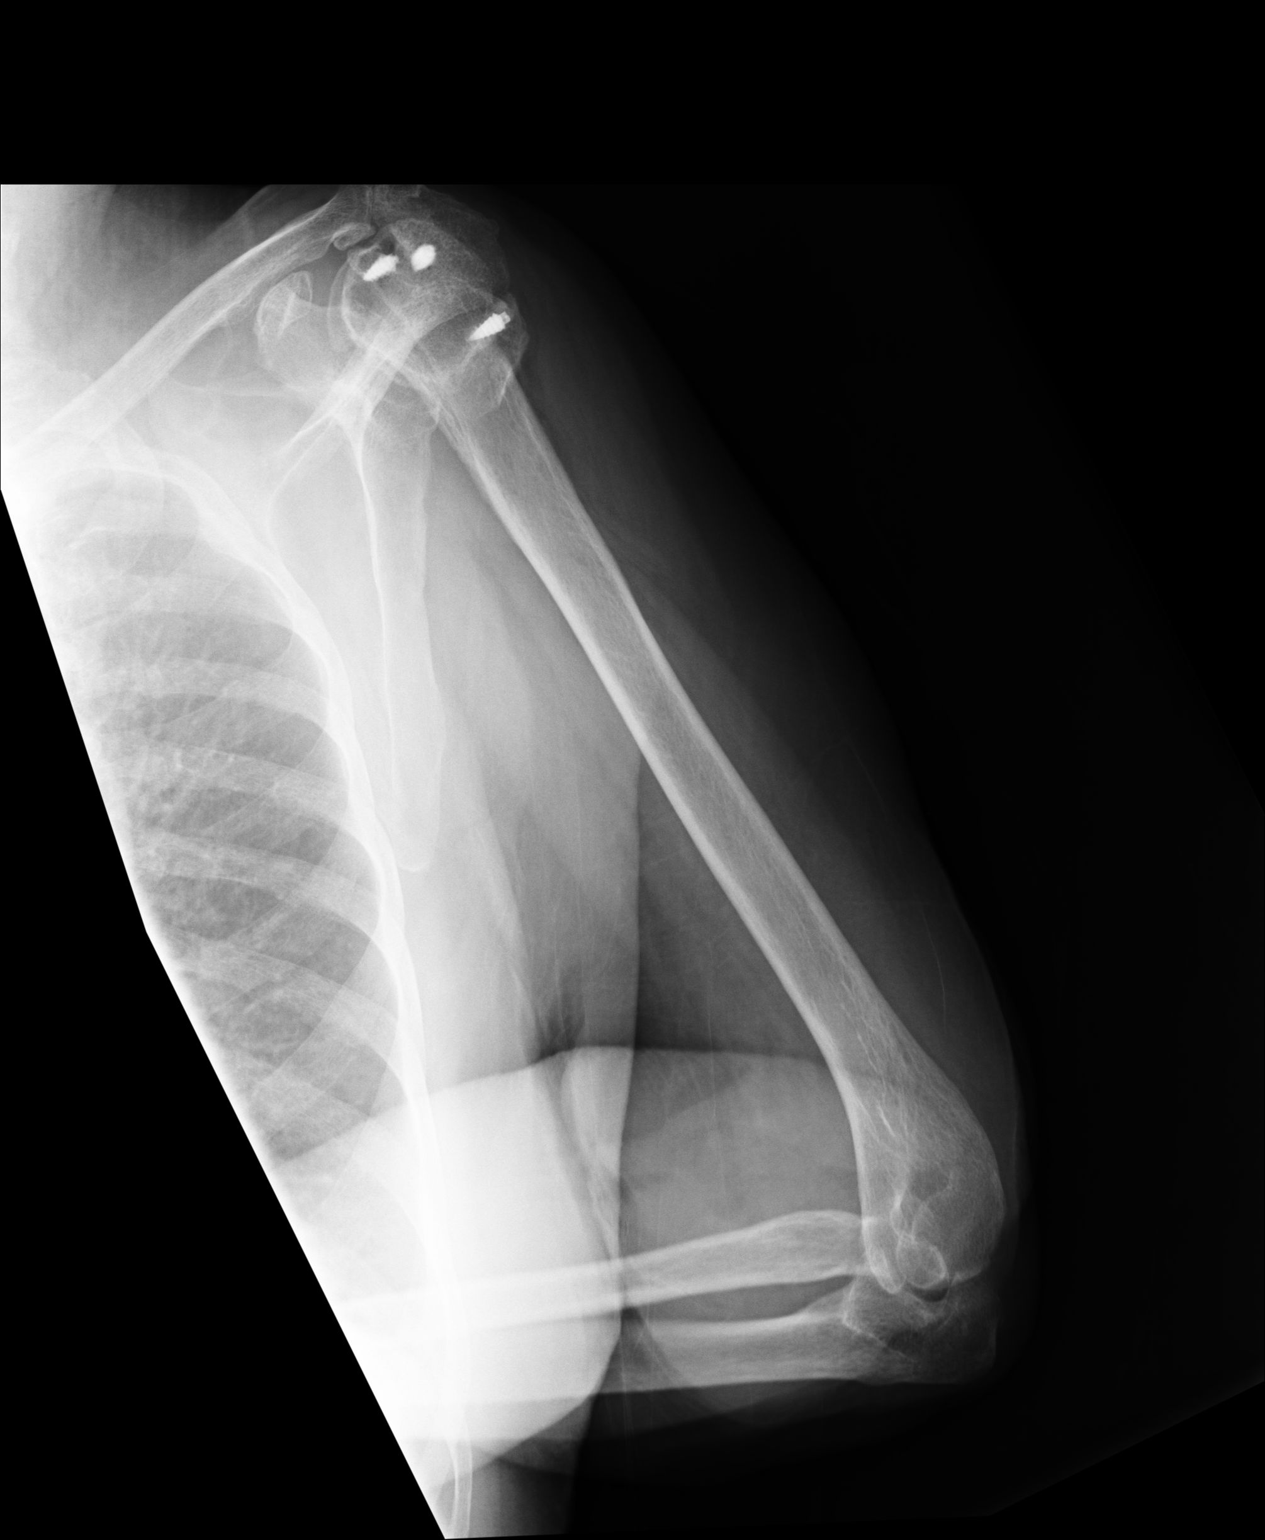

[2 of 2 positions shown; findings below may reference images not displayed]

FINDINGS: Three surgical anchors are demonstrated in the humeral head. There is age
indeterminate posttraumatic or postsurgical deformity of the humeral head.
No fracture demonstrated in the remainder of the humerus.
IMPRESSION: Age indeterminate posttraumatic or postsurgical deformity of the humeral
head. Recommend attention to dedicated shoulder radiographs, already
performed.

## 2012-08-18 IMAGING — CR DG SHOULDER 3+V*L*
1 series · 3 of 3 positions shown · non-contrast
Comparison: none

REASON FOR EXAM: pain
COMMENTS:

PROCEDURE:     DXR - DXR SHOULDER LEFT COMPLETE  - August 30, 2009  [DATE]
RESULT:     Comparison: None.

[Series 1: view not recorded · 0.17mm/px · 3 of 3 slices shown]
[im 1/3]
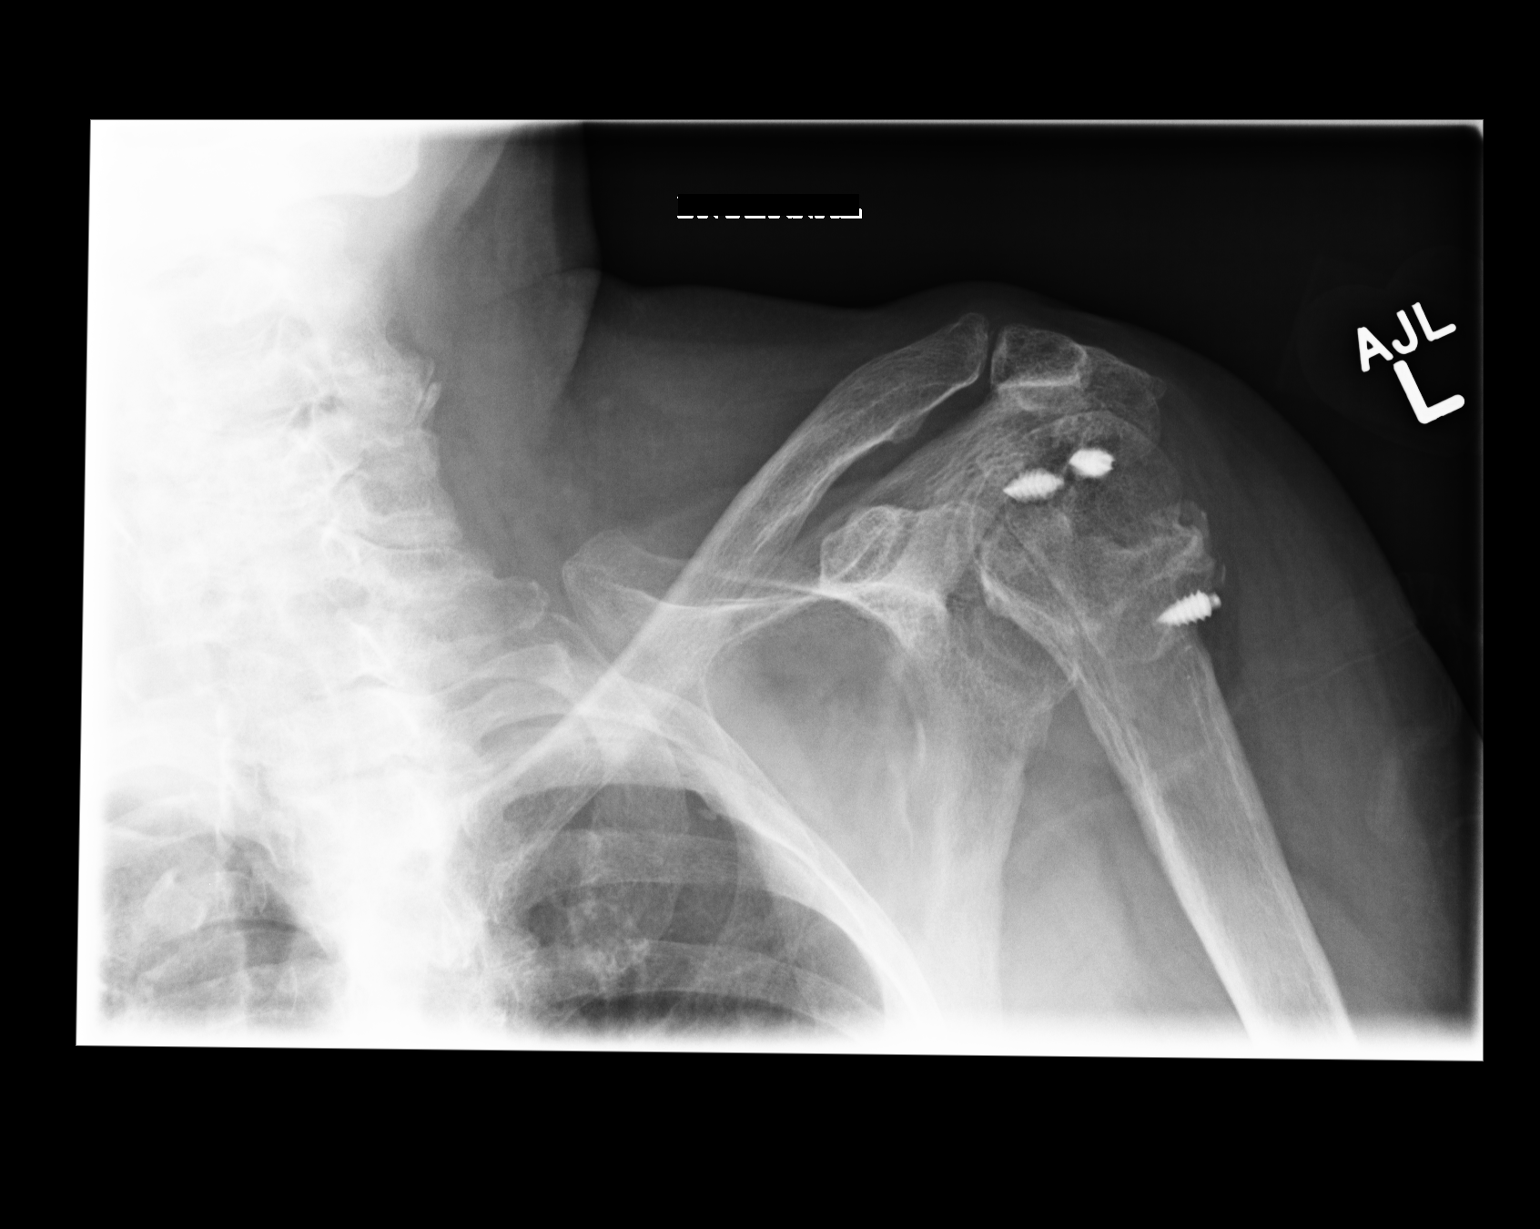
[im 2/3]
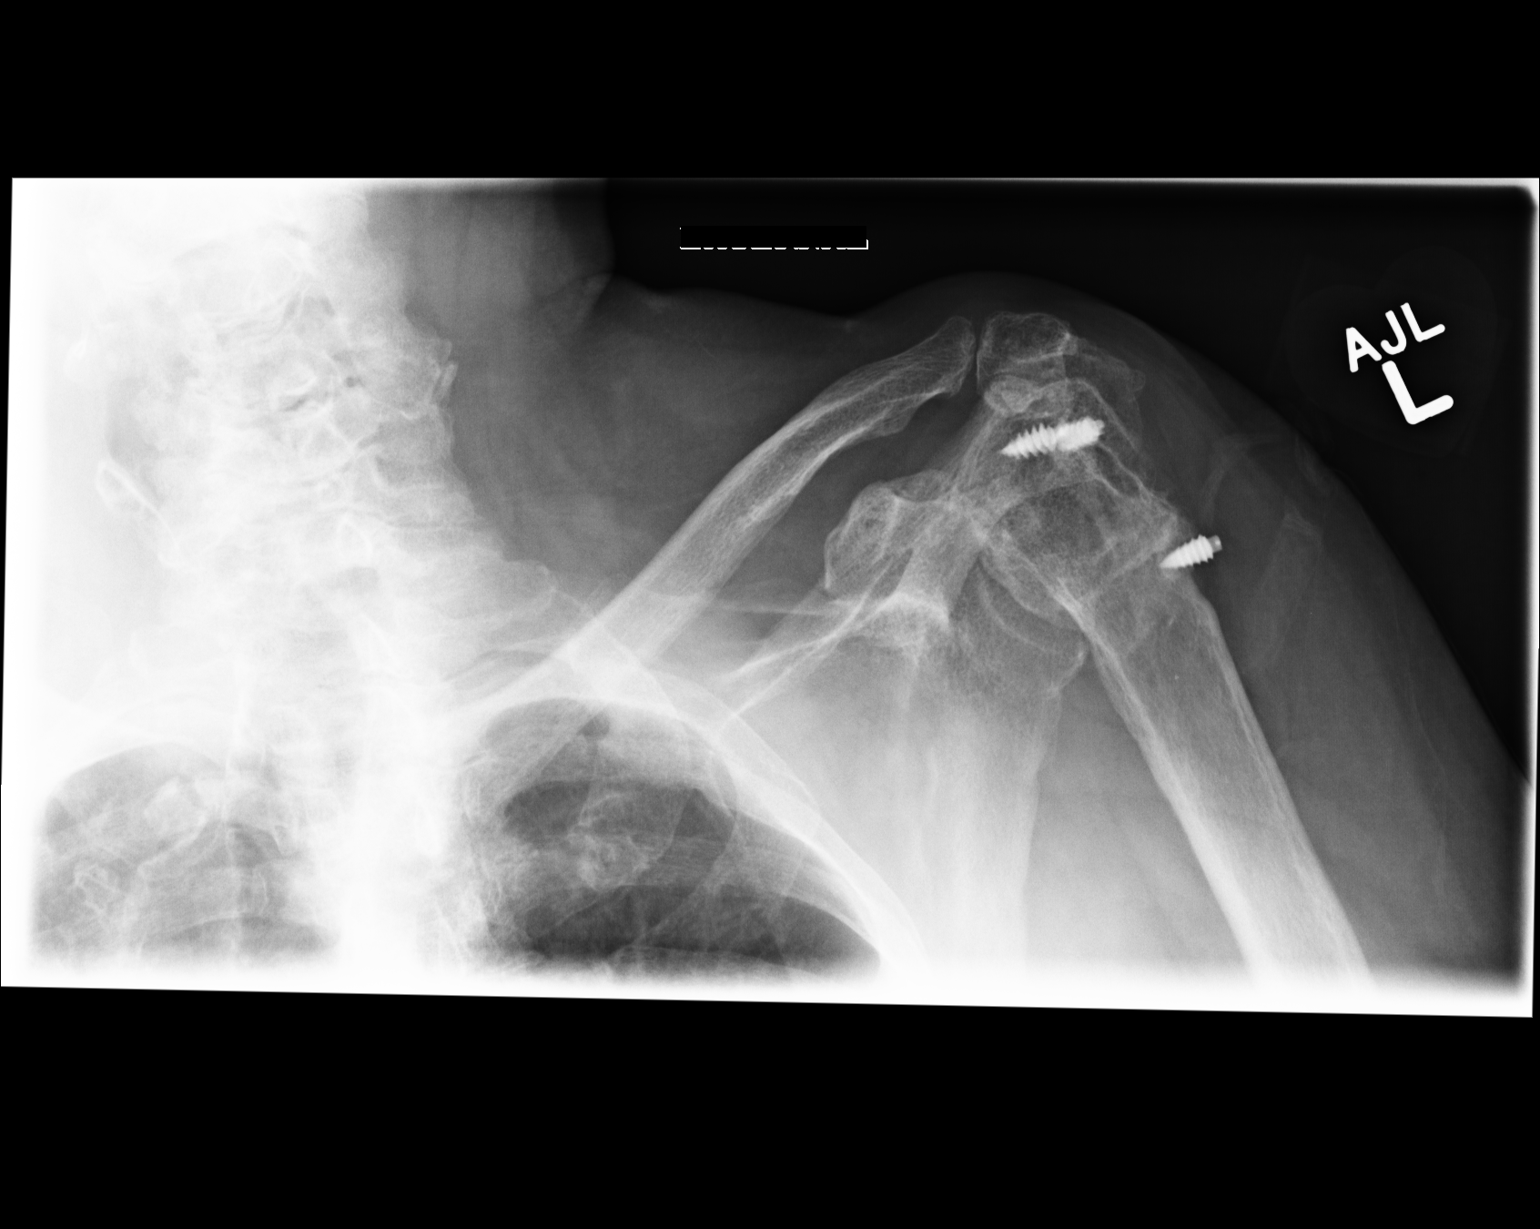
[im 3/3]
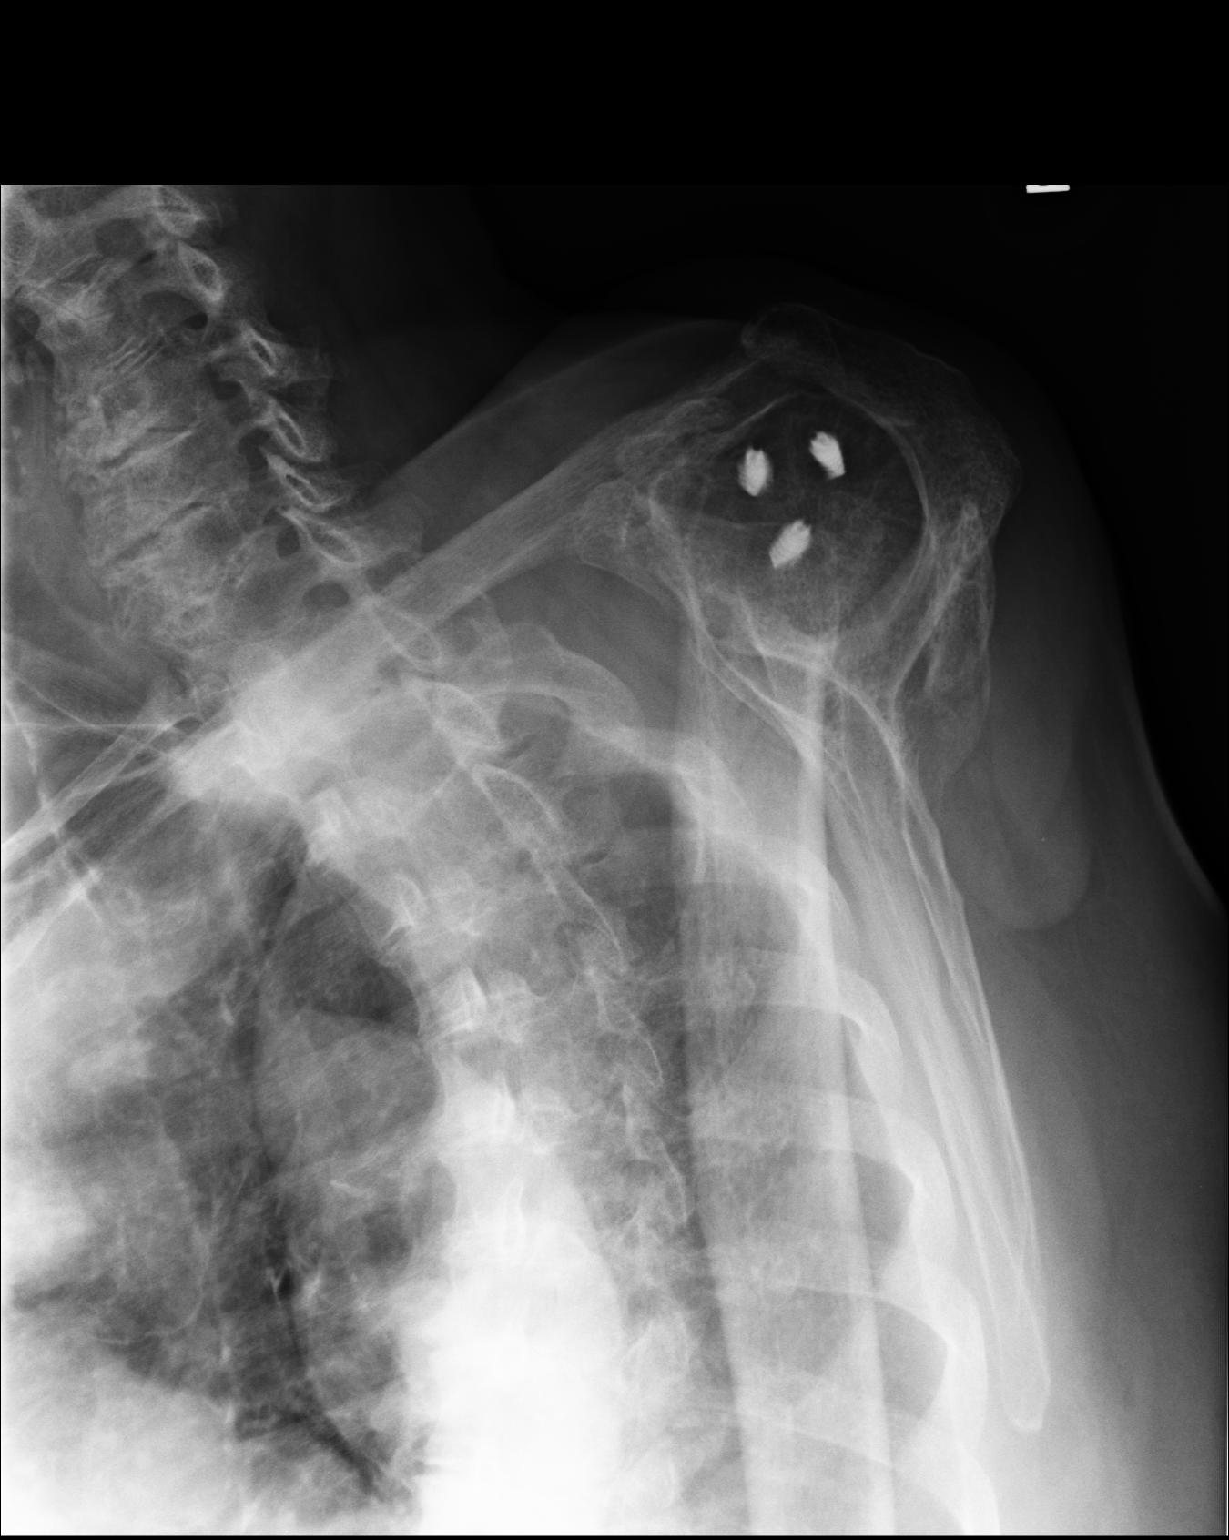

[3 of 3 positions shown; findings below may reference images not displayed]

FINDINGS: Three surgical anchors are demonstrated the humeral head. Old posttraumatic
and post surgical changes are demonstrated in the humeral head and neck. No
definite acute fracture seen.
IMPRESSION: Old posttraumatic and post surgical changes in the humeral head. These
changes limit evaluation for acute fracture. However, no definite acute
fracture identified. Recommend comparison with outside prior radiographs.

## 2013-03-20 IMAGING — CT CT ABD-PELV W/ CM
1 of 2 series · 15 of 32 positions shown, 19 images · non-contrast
Comparison: none

REASON FOR EXAM: umbilical hernia constipation generalized abdominal pain
COMMENTS:

[Series 2: abd with 5.0 i40f · axial · 0.98mm/px · z∈[-202,+153]mm · 15 of 77 slices shown, 19 images]
[im 3/77  soft-tissue]
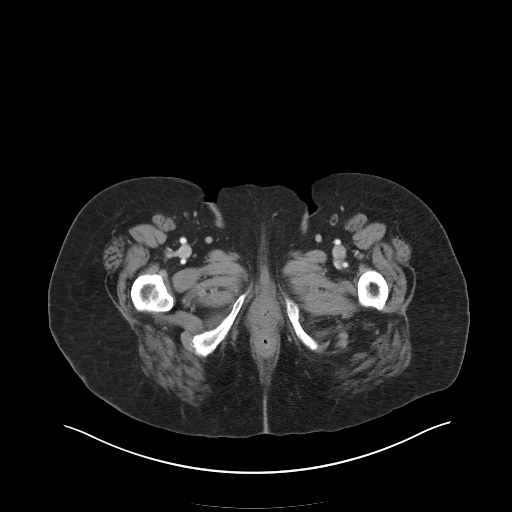
[im 3/77  bone]
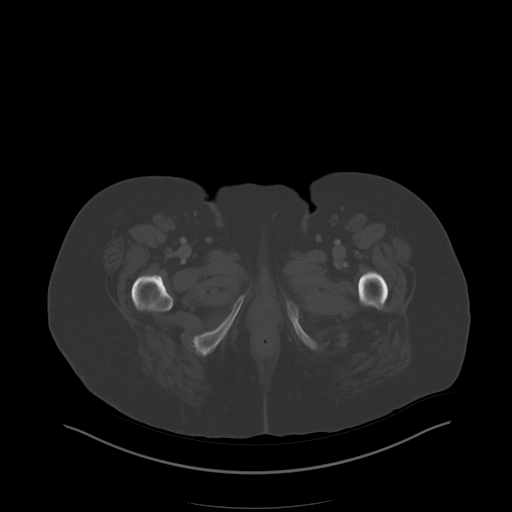
[im 9/77  soft-tissue]
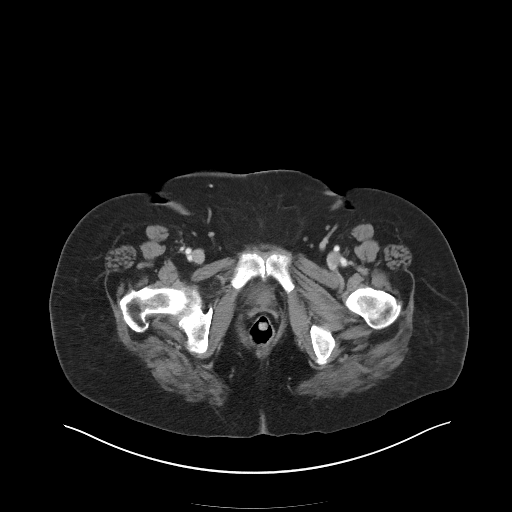
[im 15/77  soft-tissue]
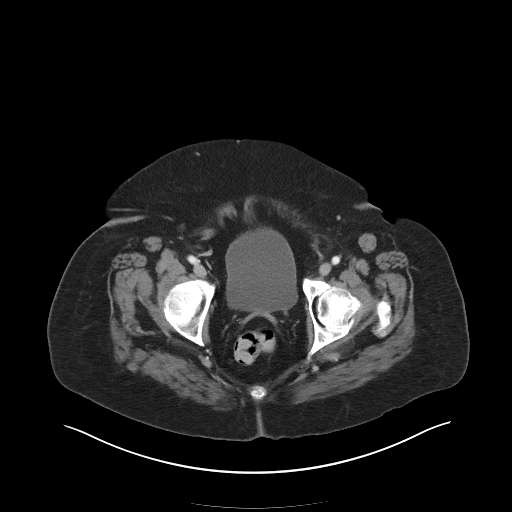
[im 21/77  soft-tissue]
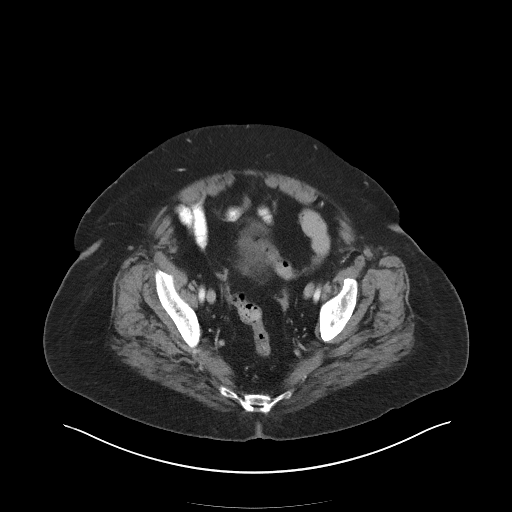
[im 27/77  soft-tissue]
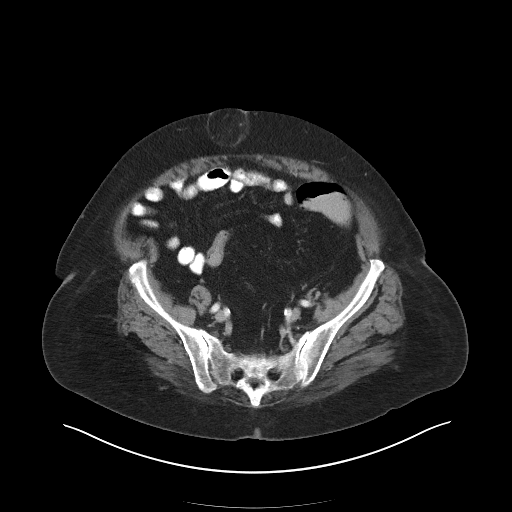
[im 33/77  soft-tissue]
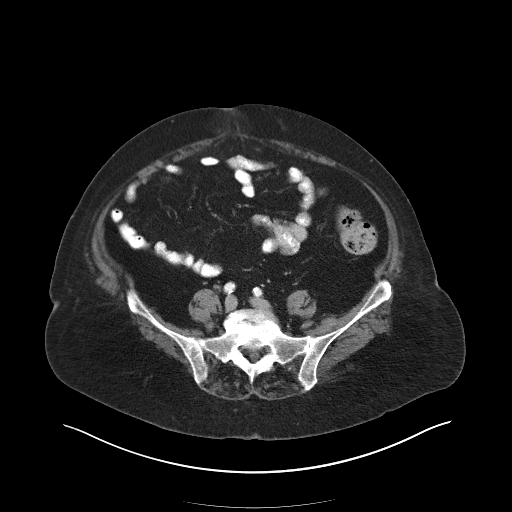
[im 39/77  soft-tissue]
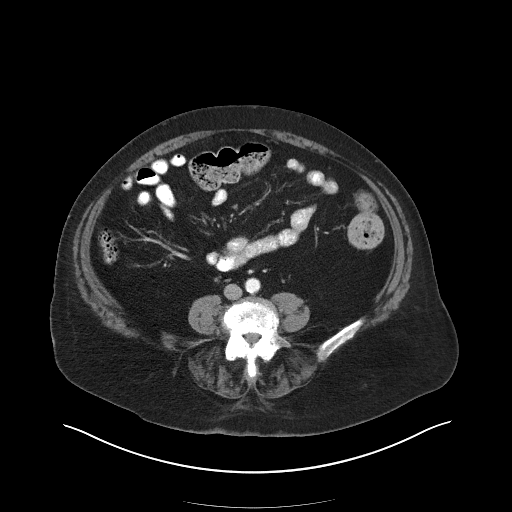
[im 44/77  soft-tissue]
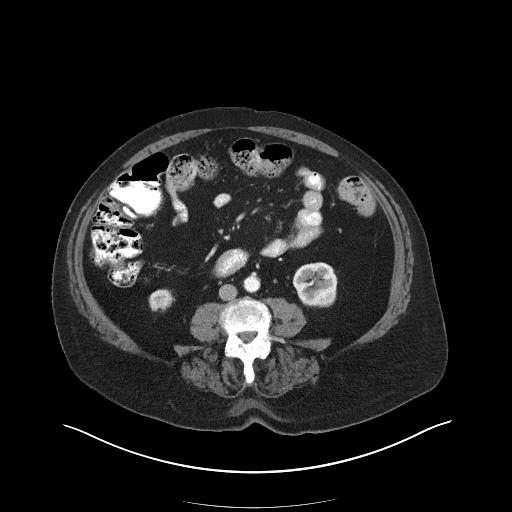
[im 50/77  soft-tissue]
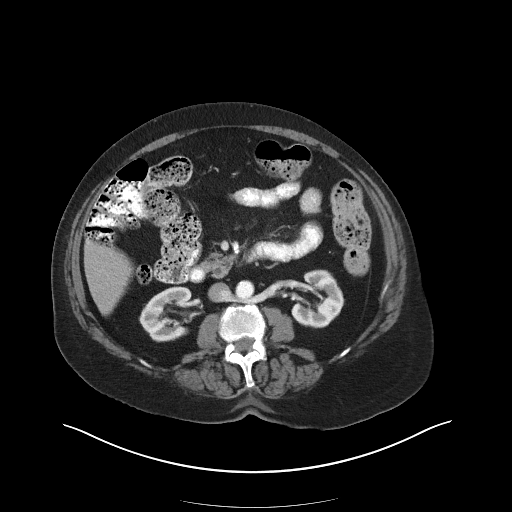
[im 50/77  bone]
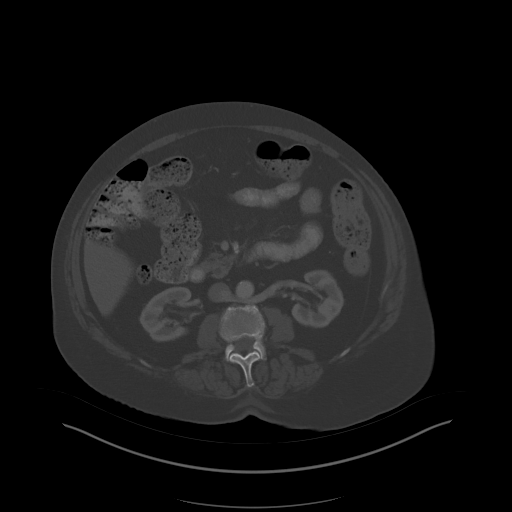
[im 56/77  soft-tissue]
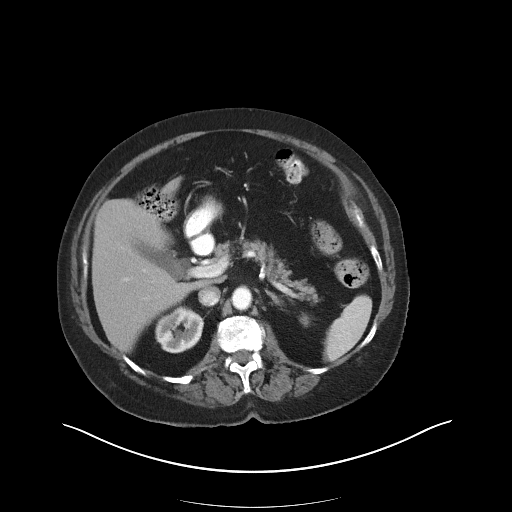
[im 62/77  soft-tissue]
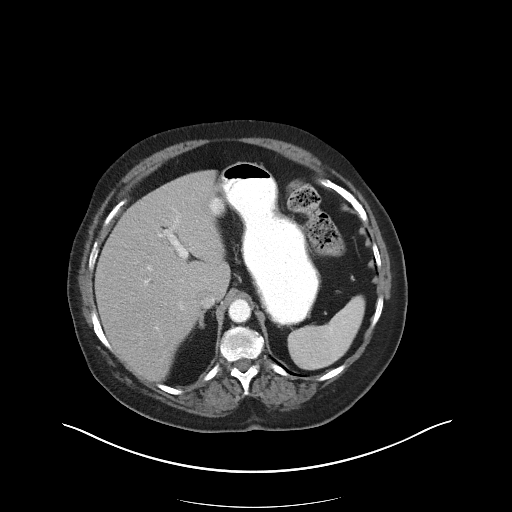
[im 65/77  lung]
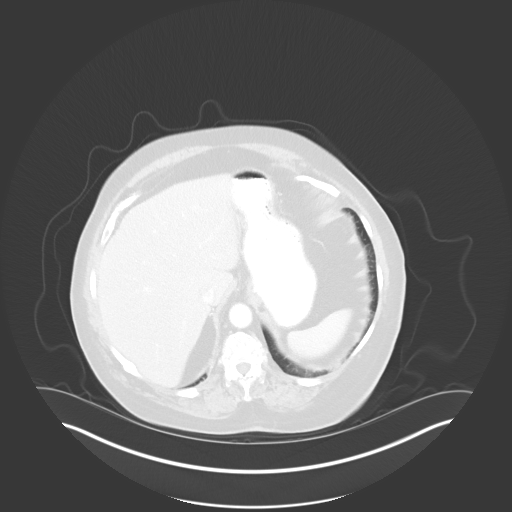
[im 68/77  soft-tissue]
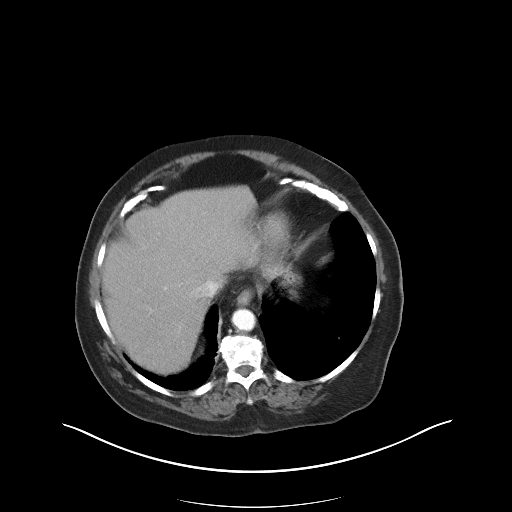
[im 68/77  lung]
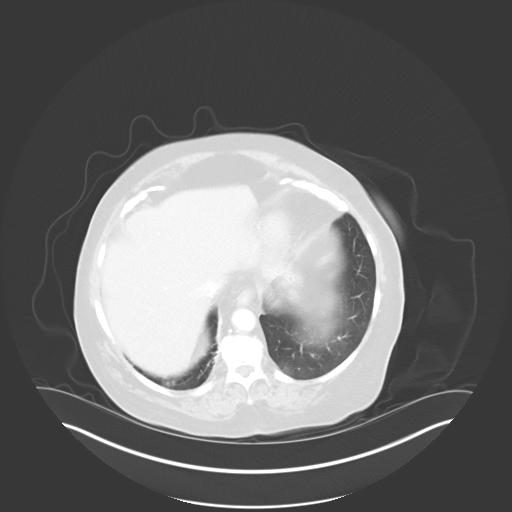
[im 71/77  lung]
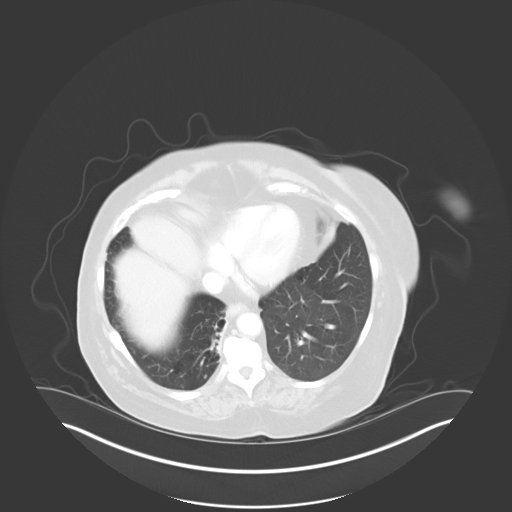
[im 74/77  soft-tissue]
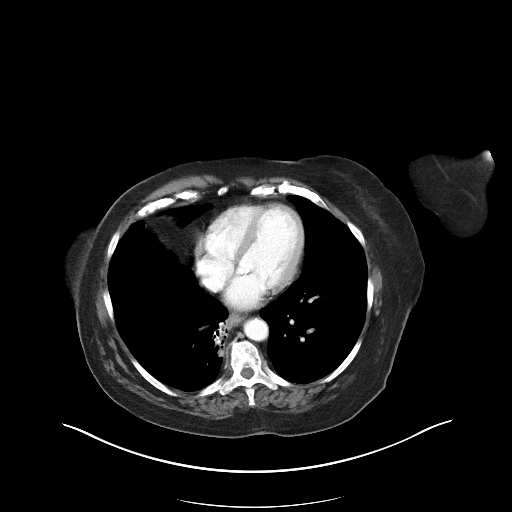
[im 74/77  lung]
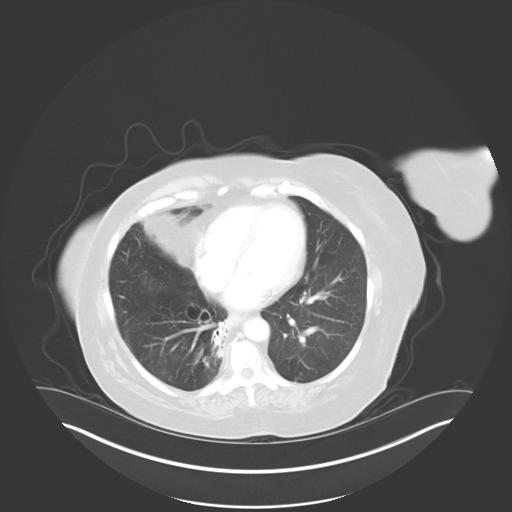

[15 of 32 positions shown; findings below may reference images not displayed]

PROCEDURE:     KCT - KCT ABDOMEN/PELVIS W  - April 01, 2010  [DATE]

RESULT:     Axial CT scanning was performed through the abdomen and pelvis
at 5 mm intervals and slice thicknesses. The patient received 85 cc of
Tsovue-ZCN.

There is an umbilical hernia containing only fat. The neck measures just
under 3 mm in diameter. Deep to this normal appearing small and large bowel
loops are demonstrated. There is no evidence of bowel obstruction or ileus
or acute inflammation. The density of fat within the hernia is normal.

The liver, gallbladder, pancreas, spleen, partially distended stomach,
adrenal glands, and kidneys are normal in appearance there is a retroaortic
left renal vein which is a normal variant. The caliber of the abdominal
aorta is normal. The uterus is surgically absent. I see no adnexal masses.
The partially distended urinary bladder is normal in appearance. There is a
small left inguinal hernia. At lung window settings the lung bases are
clear. The lumbar spine reveals no evidence of a compression fracture but
there is mild degenerative disc space narrowing at multiple levels.
IMPRESSION: 1.There is a ventral hernia containing only fat. The neck is relatively
broad at 3 cm.
2. There is a small left inguinal hernia which contains only fat.
3. I see no acute bowel abnormality nor acute abnormality elsewhere within
the abdomen or pelvis.

## 2013-05-08 IMAGING — CR DG CHEST 2V
1 series · 2 of 2 positions shown · non-contrast
Comparison: none

REASON FOR EXAM: rales and dyspnea
COMMENTS:

[Series 1: view not recorded · 0.17mm/px · 2 of 2 slices shown]
[im 1/2]
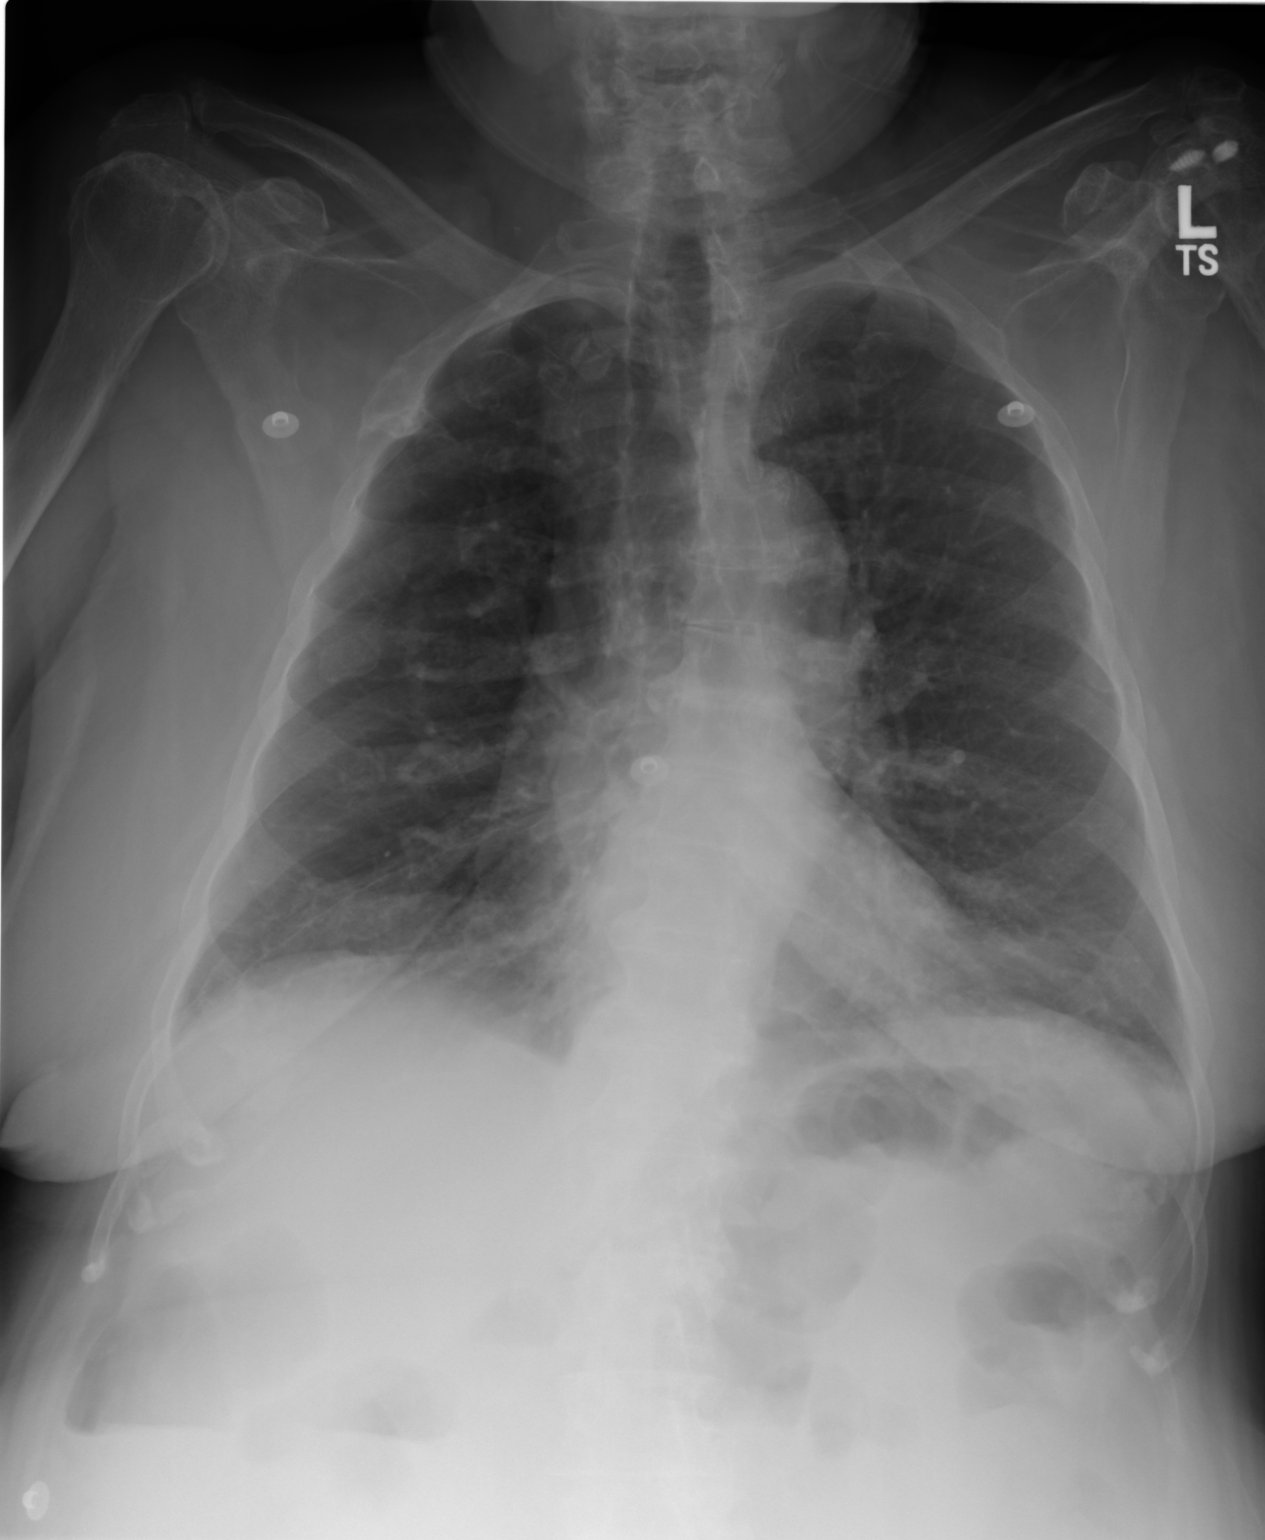
[im 2/2]
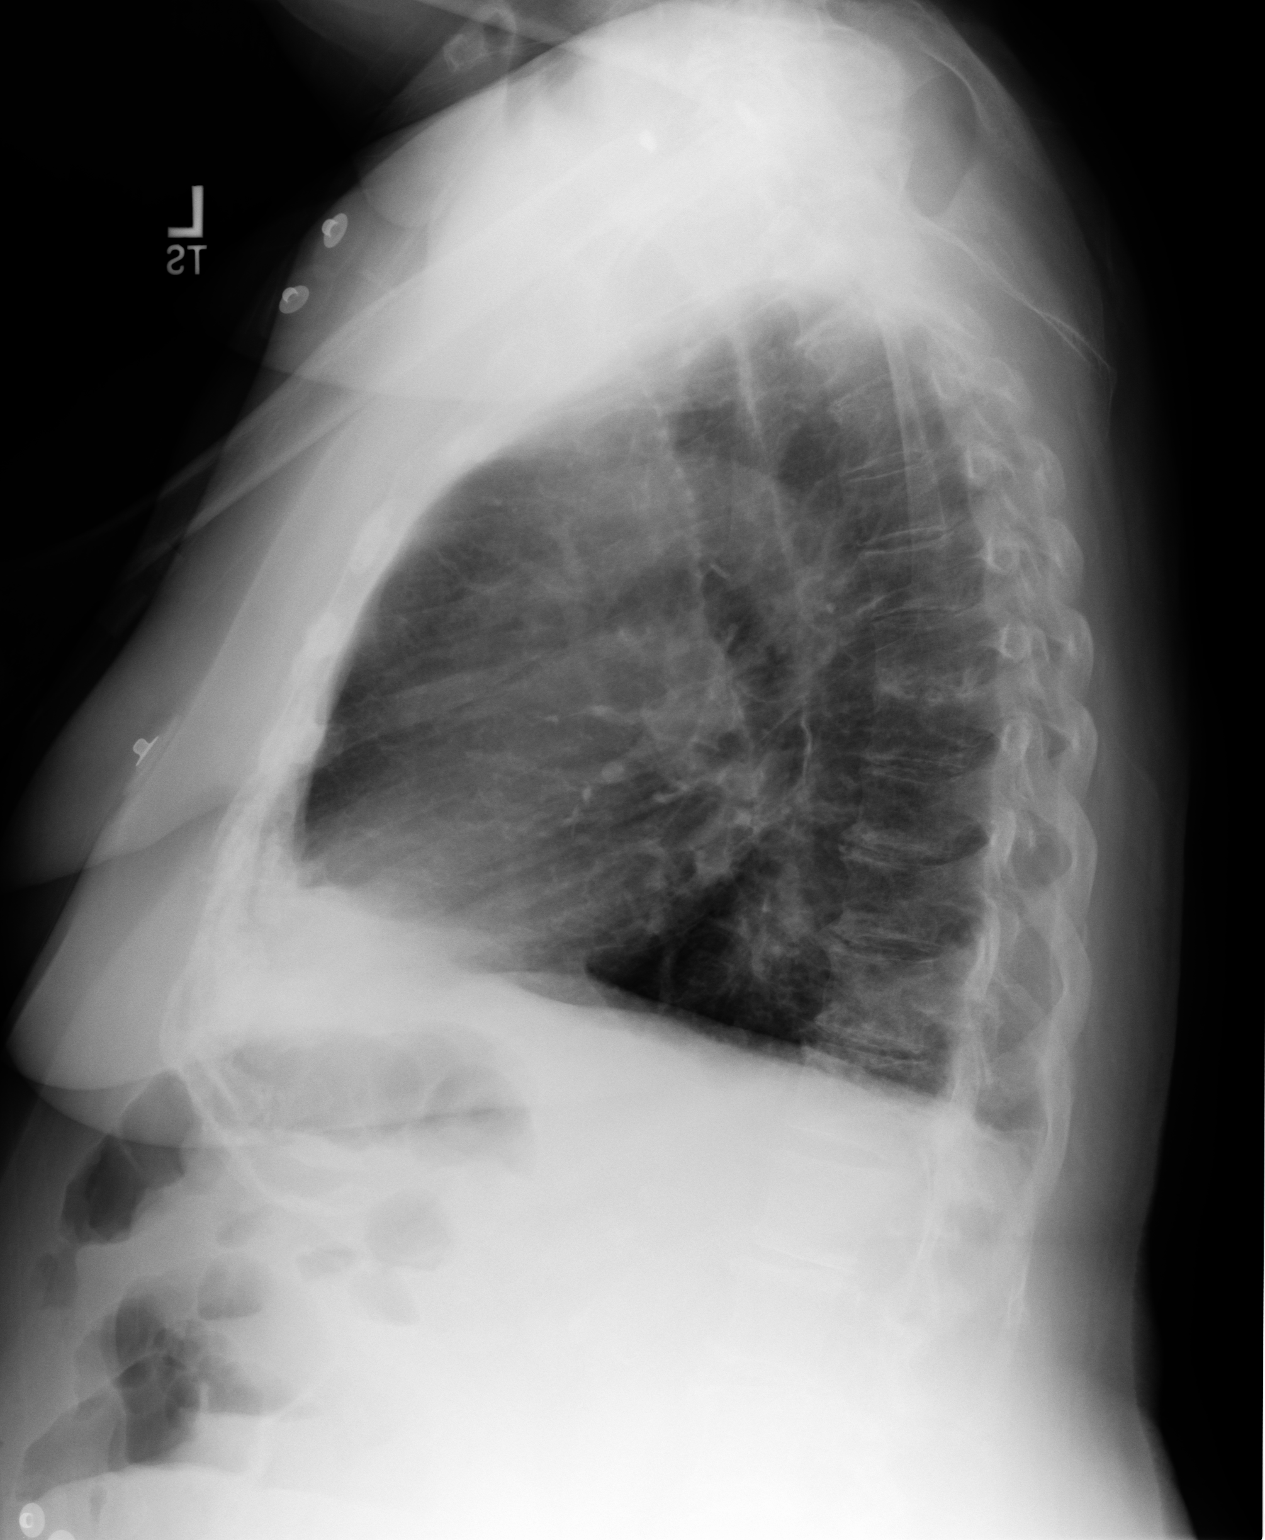

[2 of 2 positions shown; findings below may reference images not displayed]

PROCEDURE:     DXR - DXR CHEST PA (OR AP) AND LATERAL  - May 20, 2010  [DATE]

RESULT:     Comparison is made to the study of 05/12/2009.

The cardiac silhouette is at the upper limits of normal in size. Basilar
atelectasis versus minimal infiltrate is present bilaterally. The lung
markings are slightly coarse. There is no effusion or pneumothorax. There
does not appear to be evidence of pulmonary edema.
IMPRESSION: Borderline cardiomegaly with basilar atelectasis versus
infiltrate bilaterally.

## 2013-05-12 IMAGING — CR DG FEMUR 2V*L*
1 series · 5 of 5 positions shown · non-contrast
Comparison: none

REASON FOR EXAM: pain no known injury
COMMENTS:

[Series 1: view not recorded · 0.17mm/px · 5 of 5 slices shown]
[im 1/5]
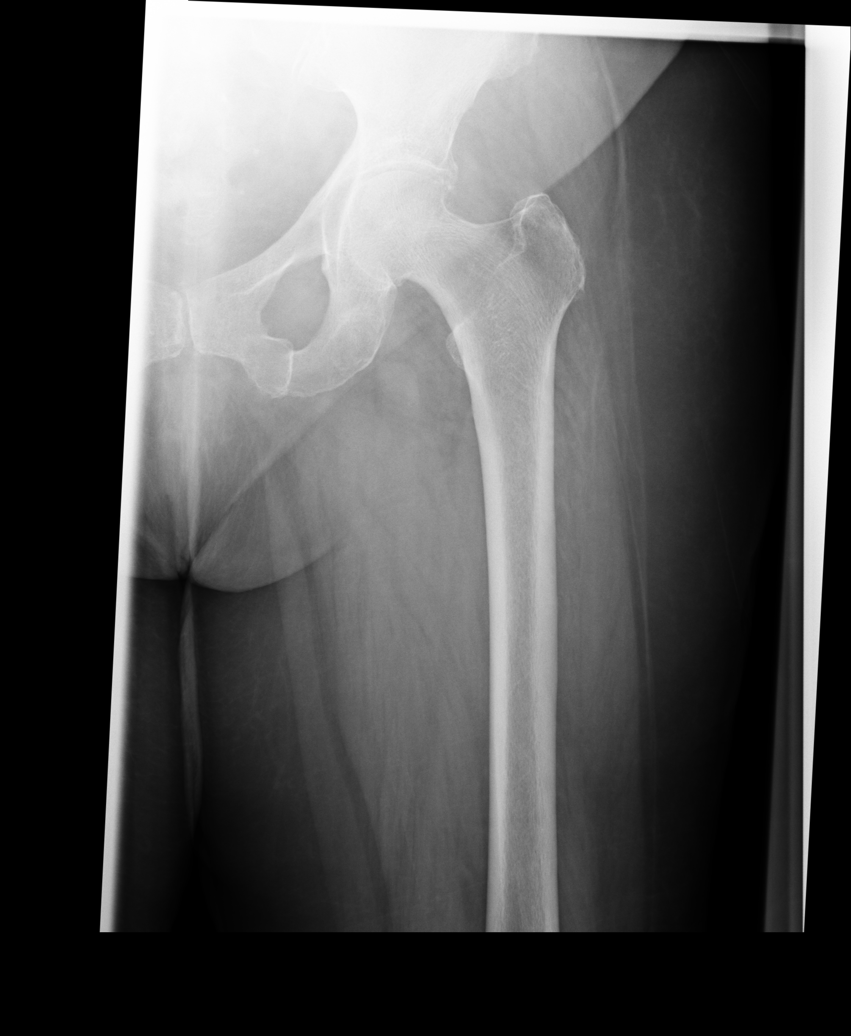
[im 2/5]
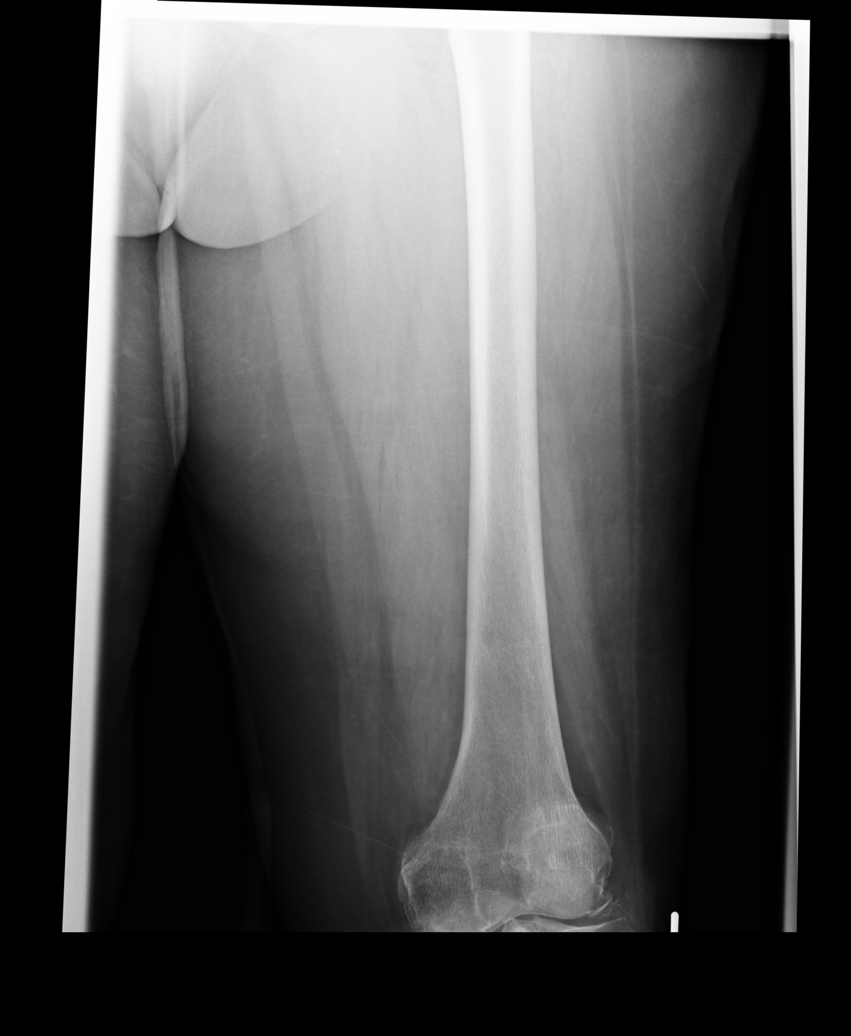
[im 3/5]
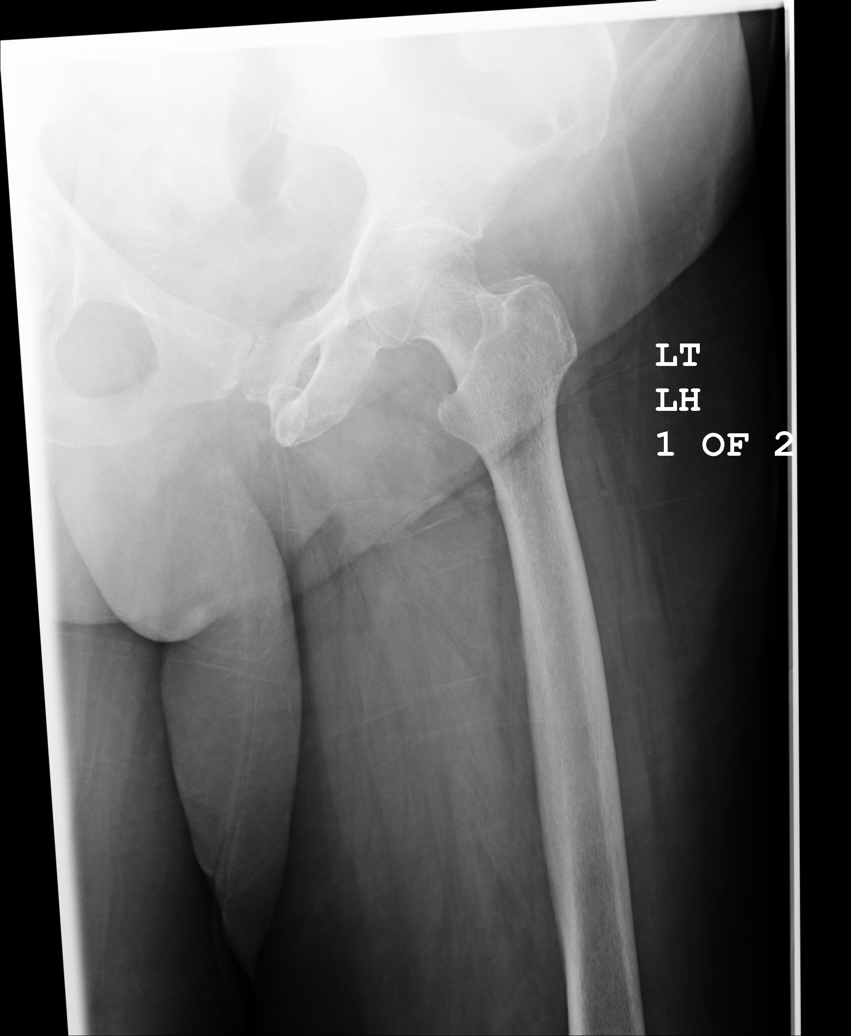
[im 4/5]
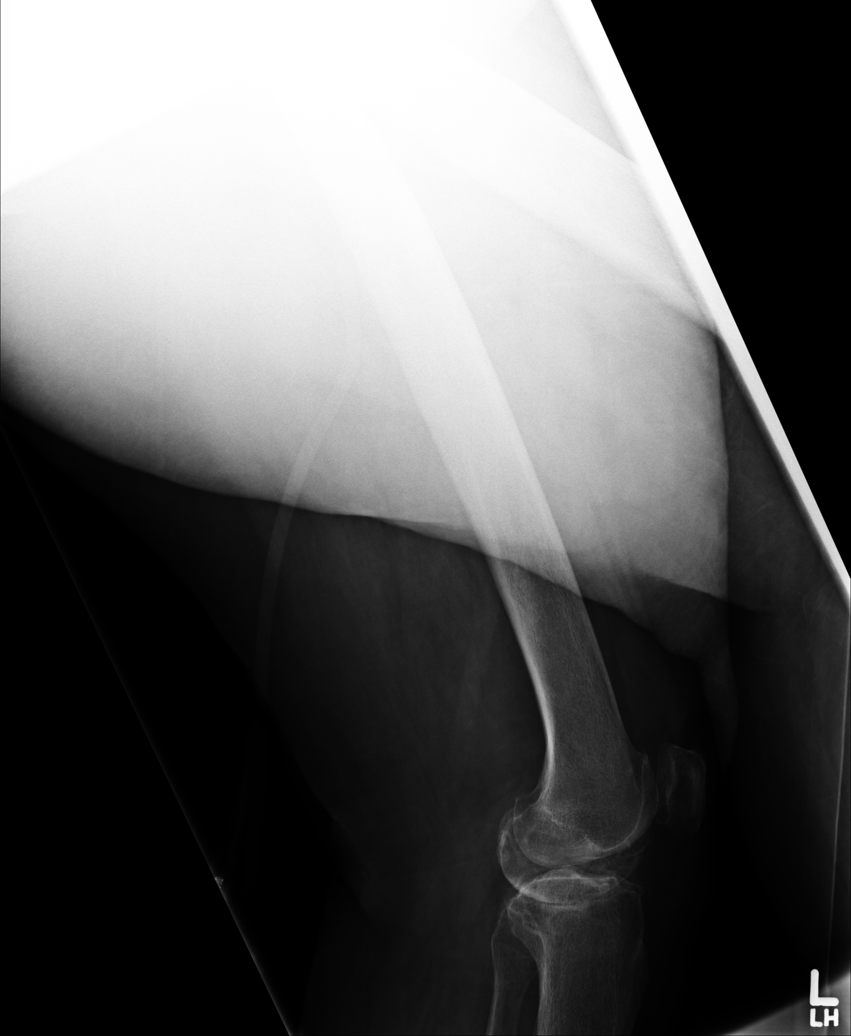
[im 5/5]
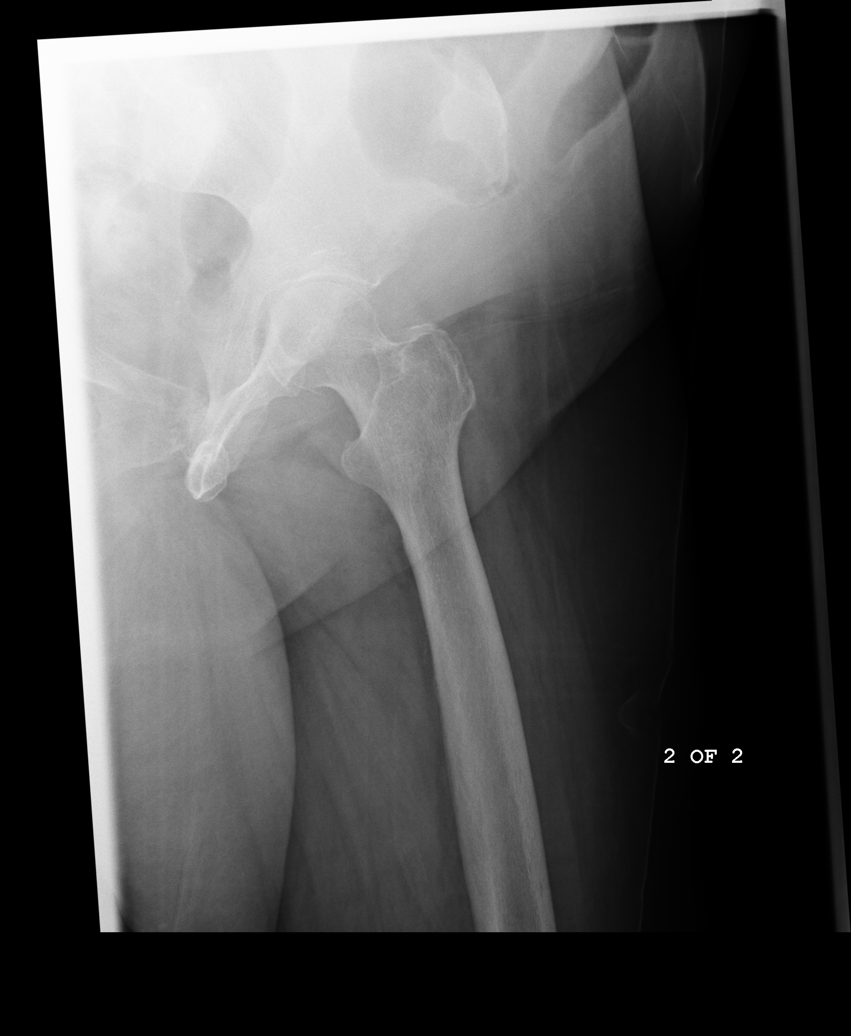

[5 of 5 positions shown; findings below may reference images not displayed]

PROCEDURE:     DXR - DXR FEMUR LEFT  - May 24, 2010  [DATE]

RESULT:     Slight irregularity is noted of the lateral femoral condyle.
Subtle fracture cannot be excluded. These changes could be degenerative.
Calcification is noted within the menisci. The femoral shaft is intact.
Prominent degenerative changes are noted about the left knee.
IMPRESSION: See above.

## 2013-05-12 IMAGING — CR DG FEMUR 2V*R*
1 series · 5 of 5 positions shown · non-contrast
Comparison: none

REASON FOR EXAM: pain no known injury
COMMENTS:

[Series 1: view not recorded · 0.17mm/px · 5 of 5 slices shown]
[im 1/5]
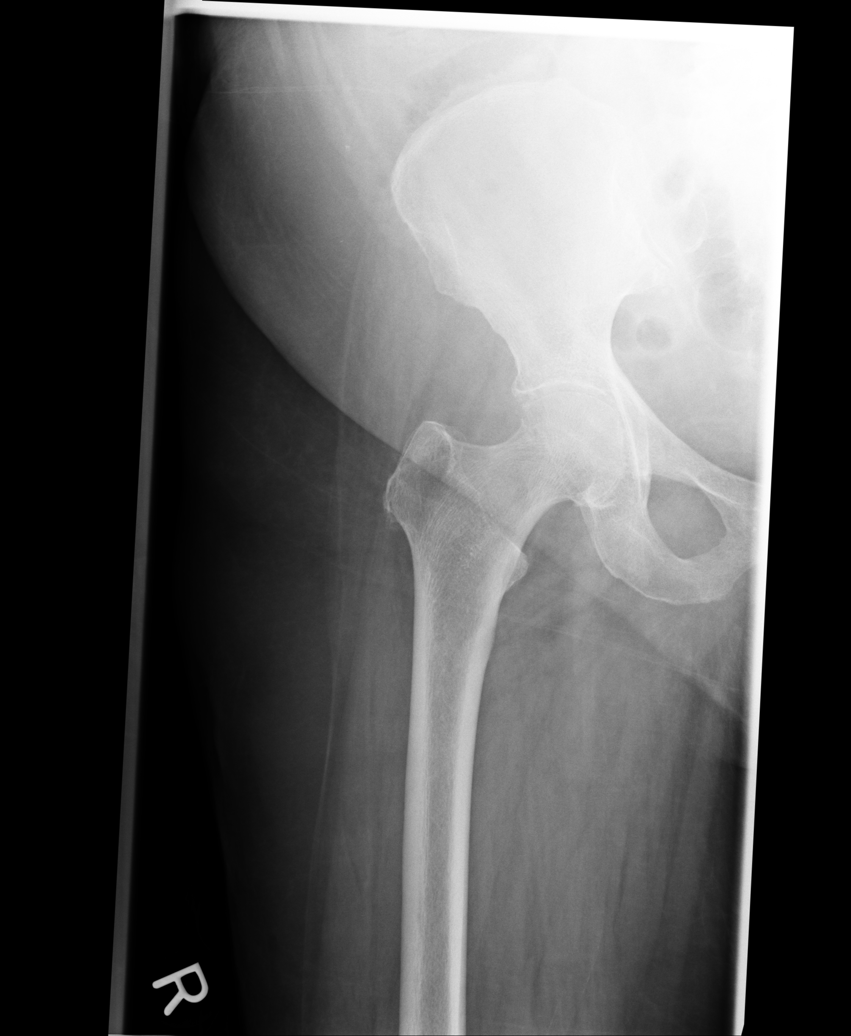
[im 2/5]
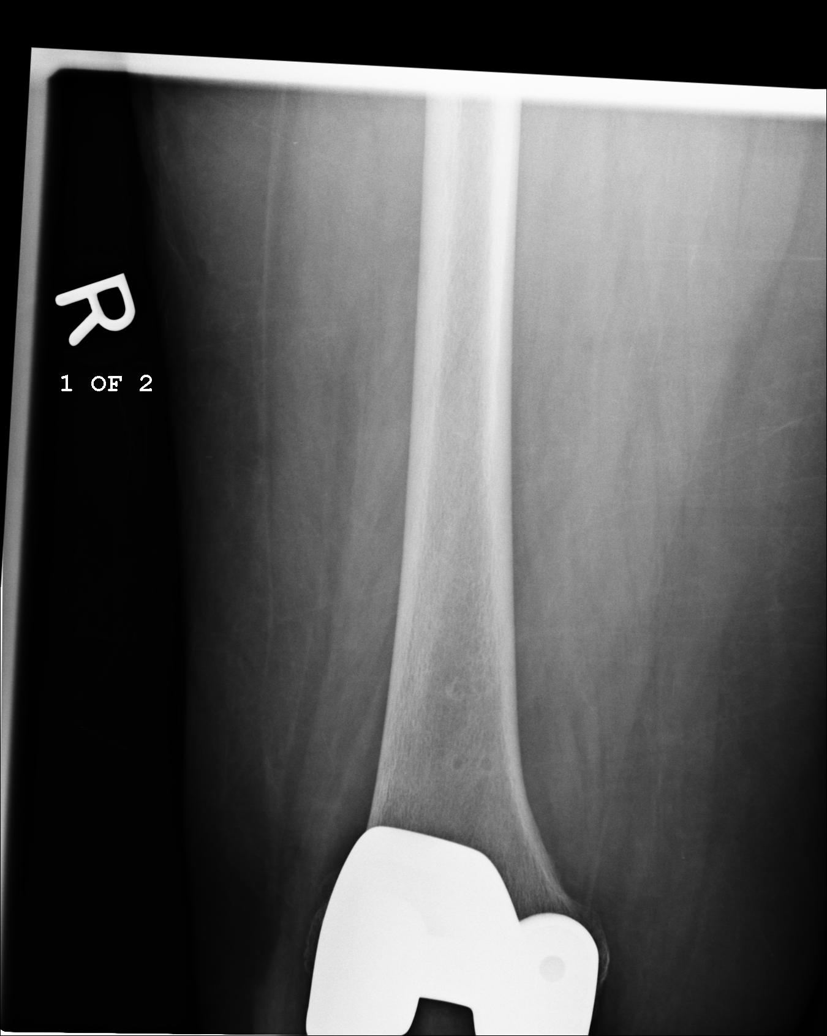
[im 3/5]
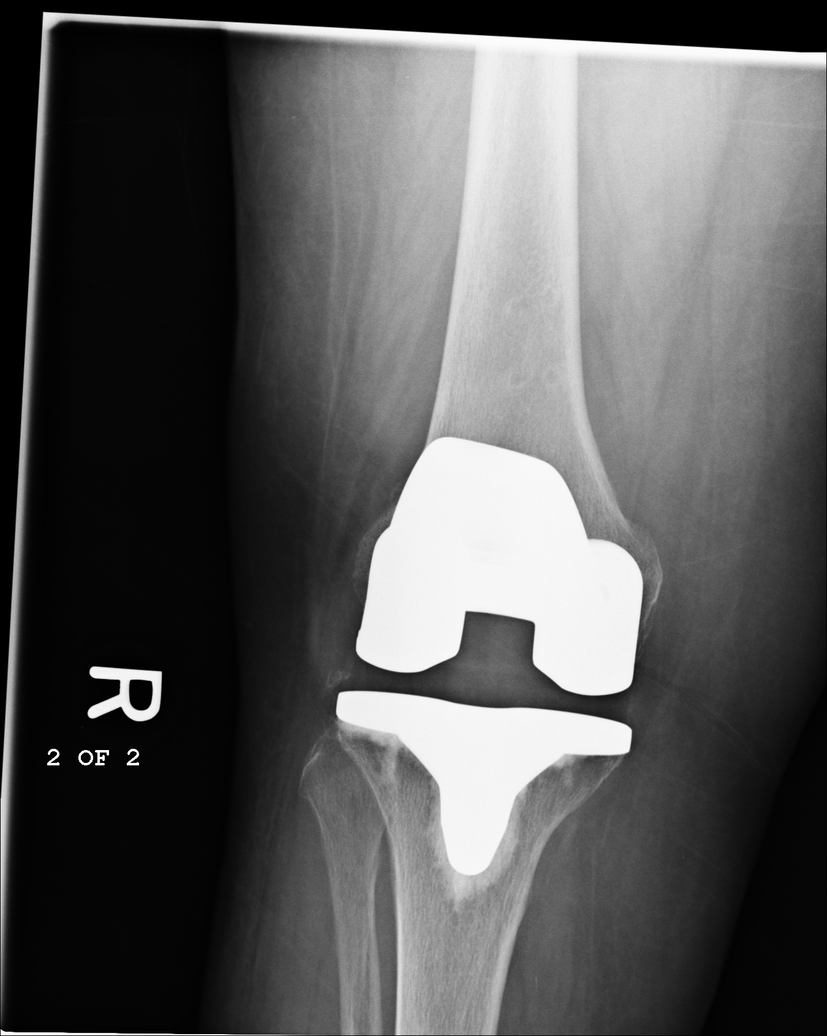
[im 4/5]
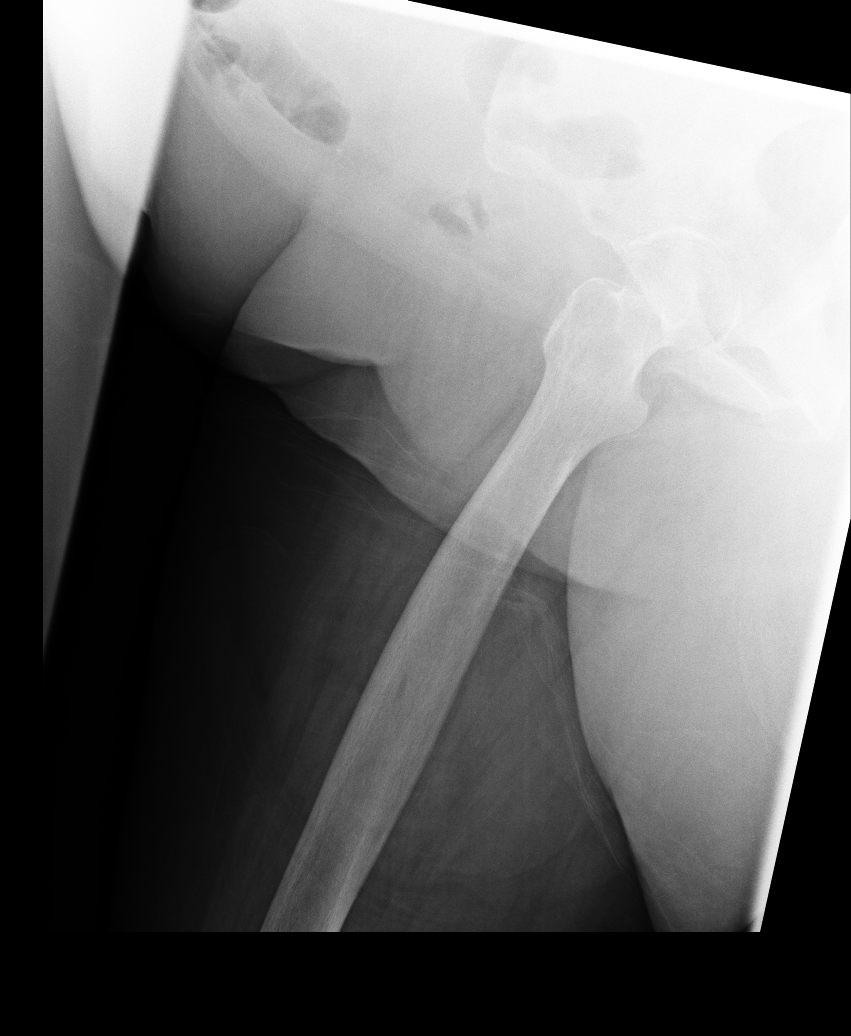
[im 5/5]
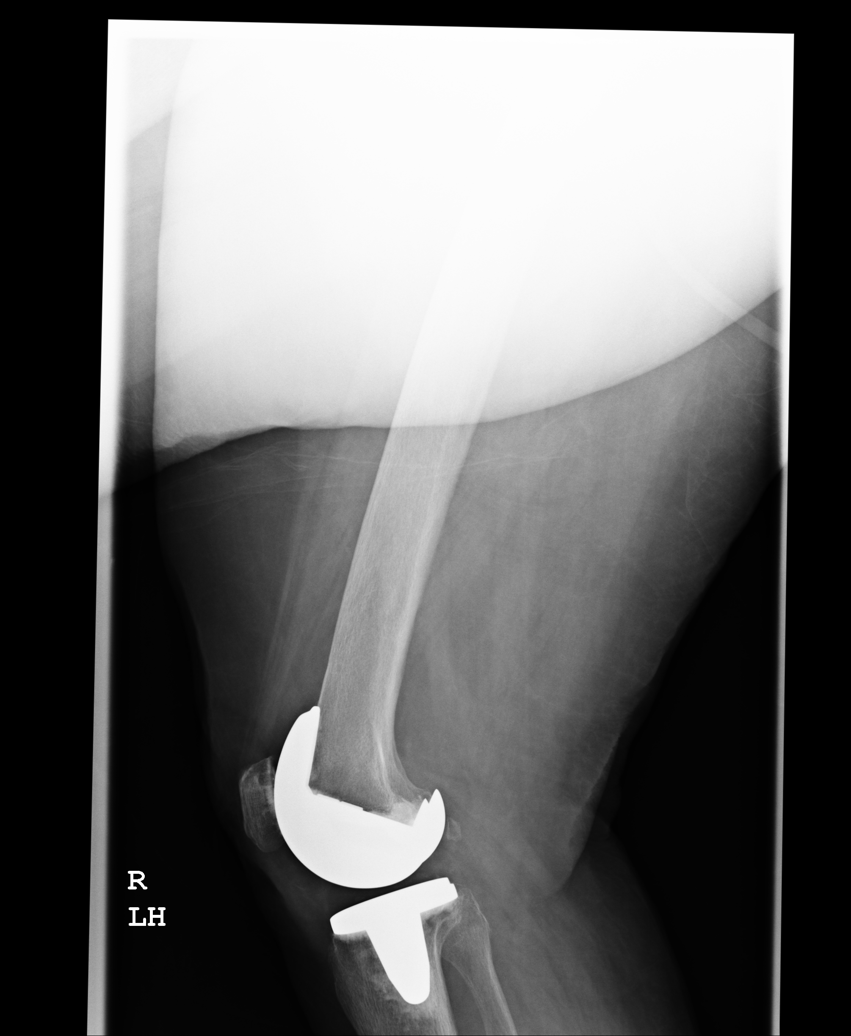

[5 of 5 positions shown; findings below may reference images not displayed]

PROCEDURE:     DXR - DXR FEMUR RIGHT  - May 24, 2010  [DATE]

RESULT:     The patient has had prior right knee replacement. No acute or
focal bony abnormality identified. Old screw holes are noted in the distal
femur. It should be noted if myeloma is of clinical concern in this patient,
serum protein electrophoresis should be considered. If metastatic disease is
of clinical concern, we can perform Bone Scan.
IMPRESSION: See above.

## 2013-05-12 IMAGING — CR DG TIBIA/FIBULA 2V*L*
1 series · 3 of 3 positions shown · non-contrast
Comparison: none

REASON FOR EXAM: pain, ams
COMMENTS:

RESULT:     No acute bony abnormality identified. Degenerative changes noted
about the left knee.

[Series 1: view not recorded · 0.17mm/px · 3 of 3 slices shown]
[im 1/3]
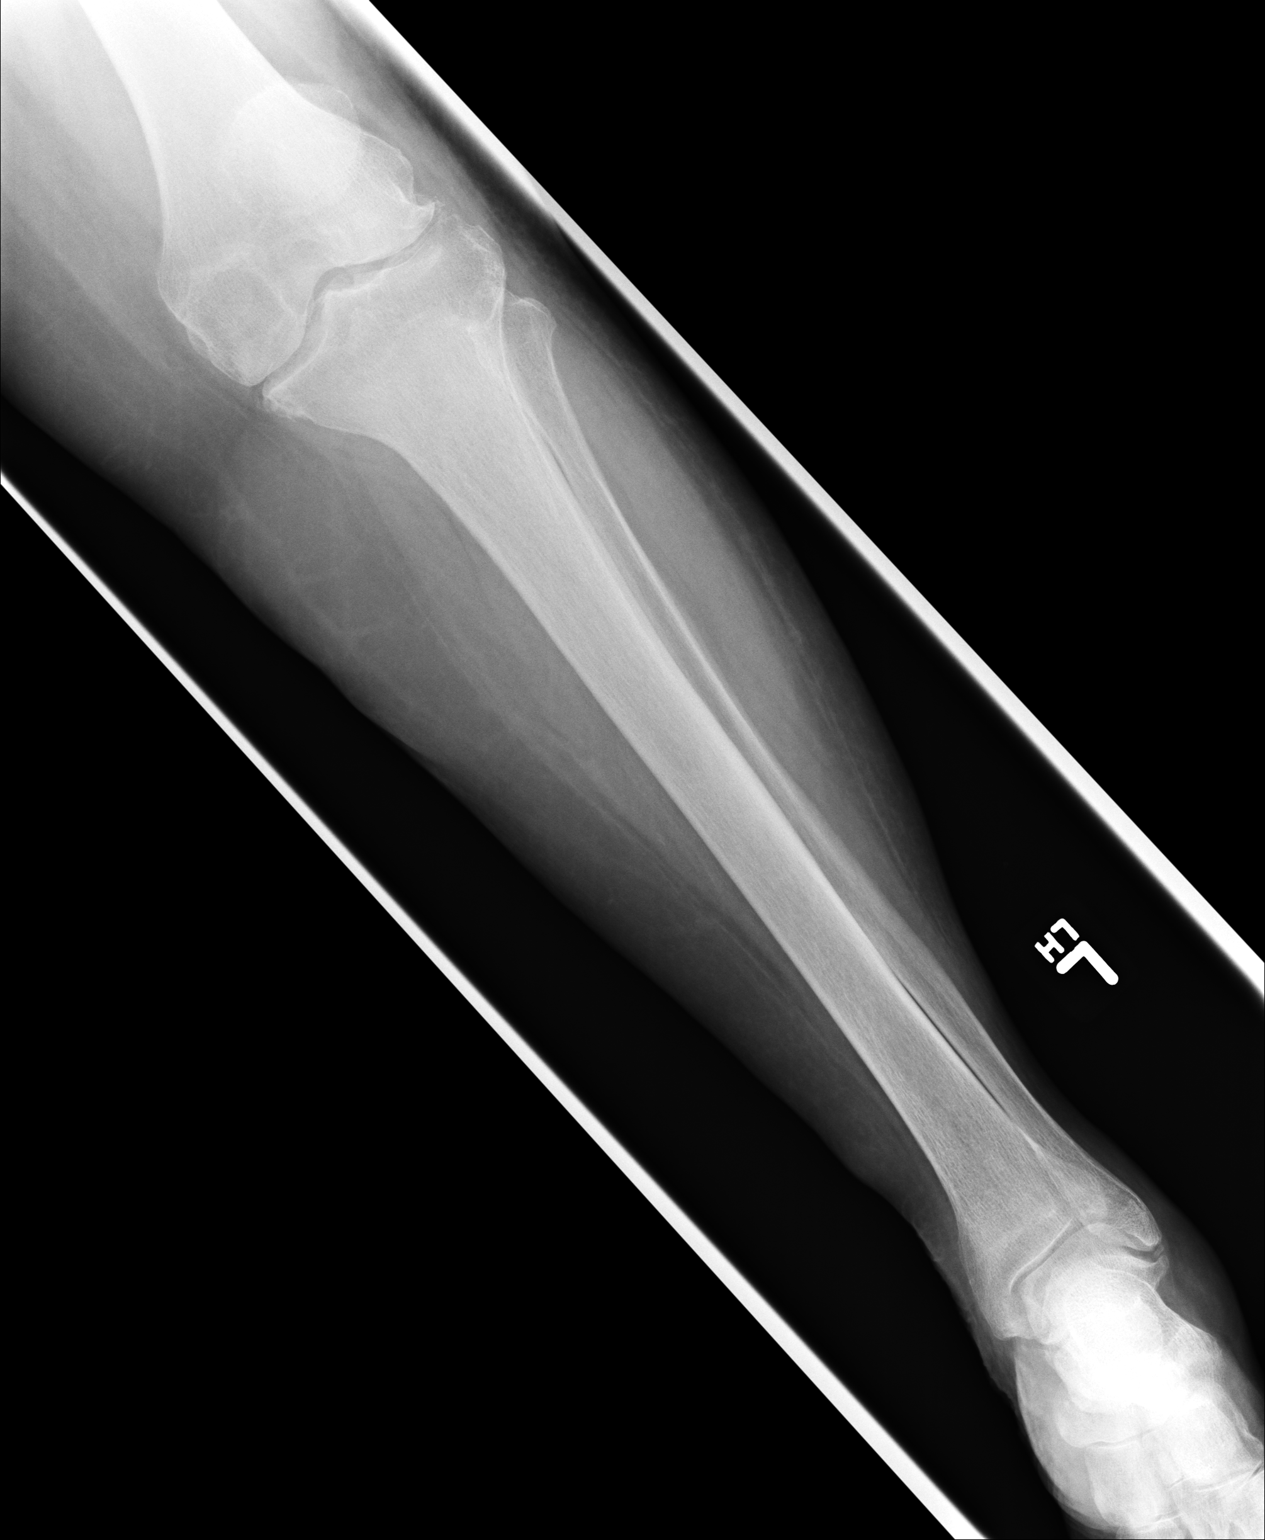
[im 2/3]
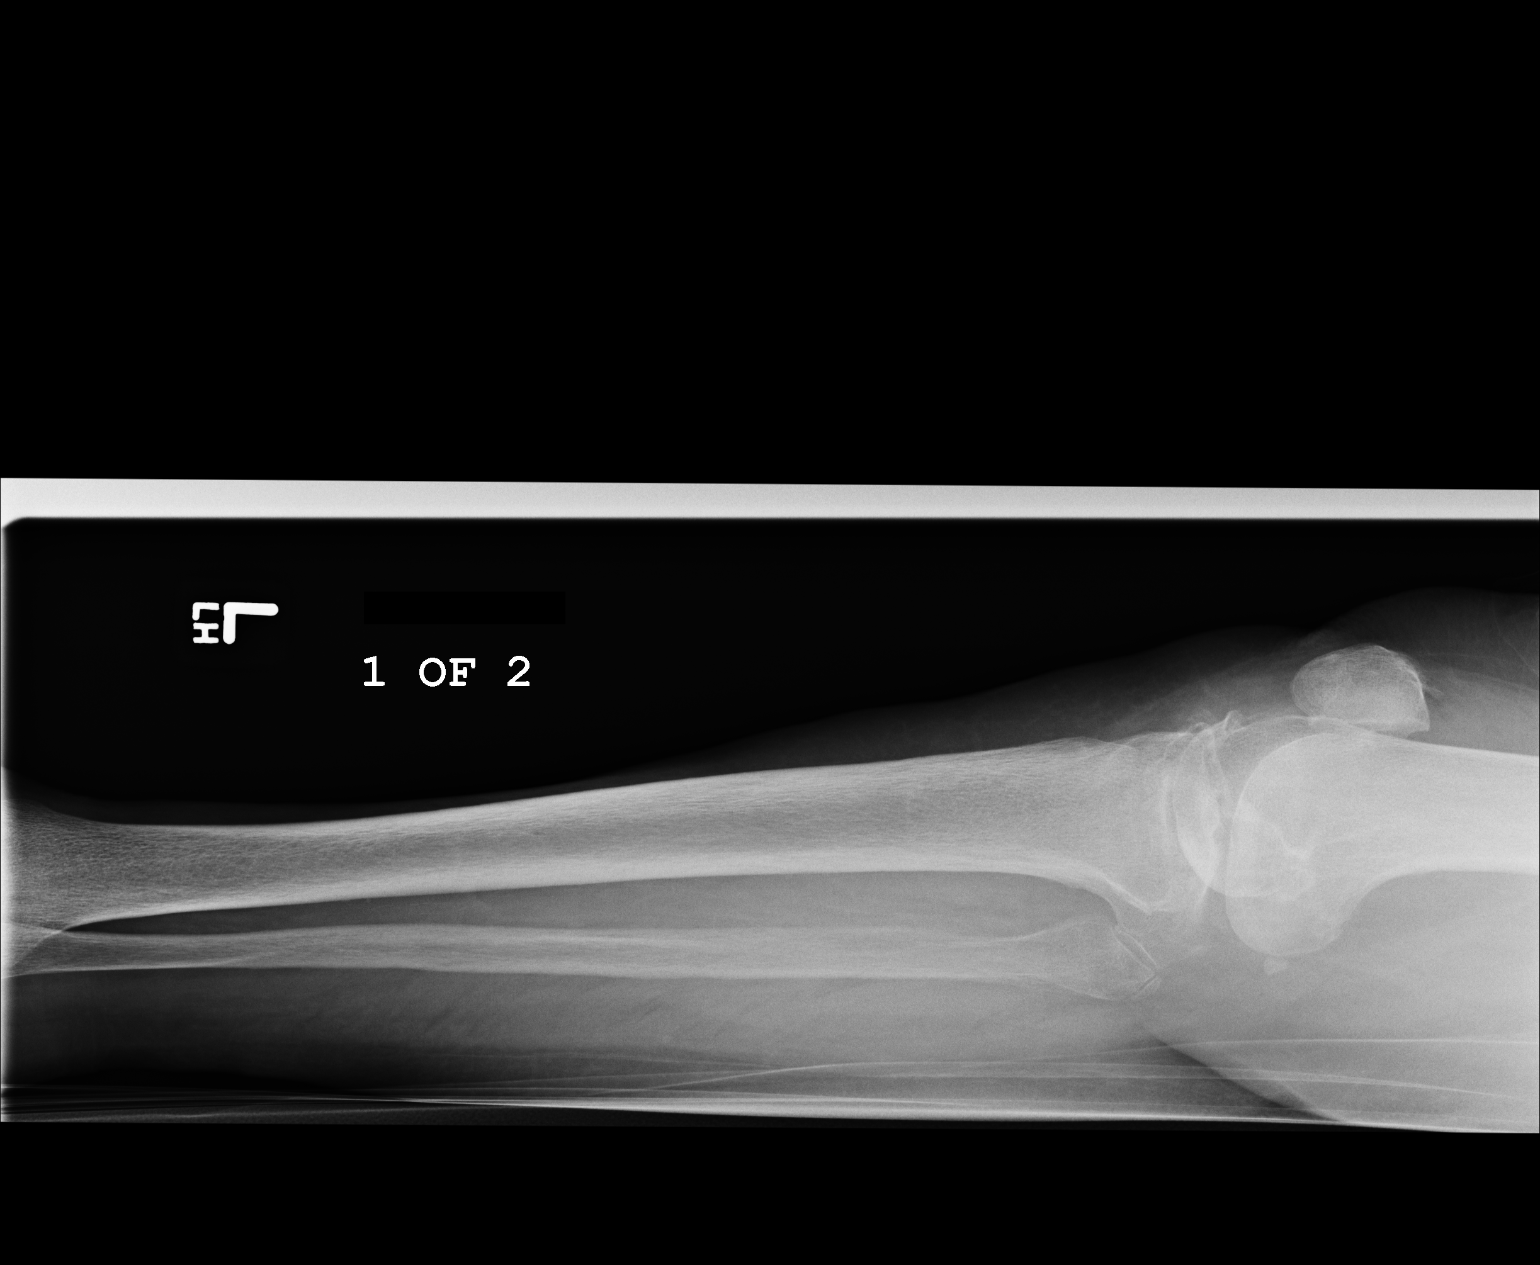
[im 3/3]
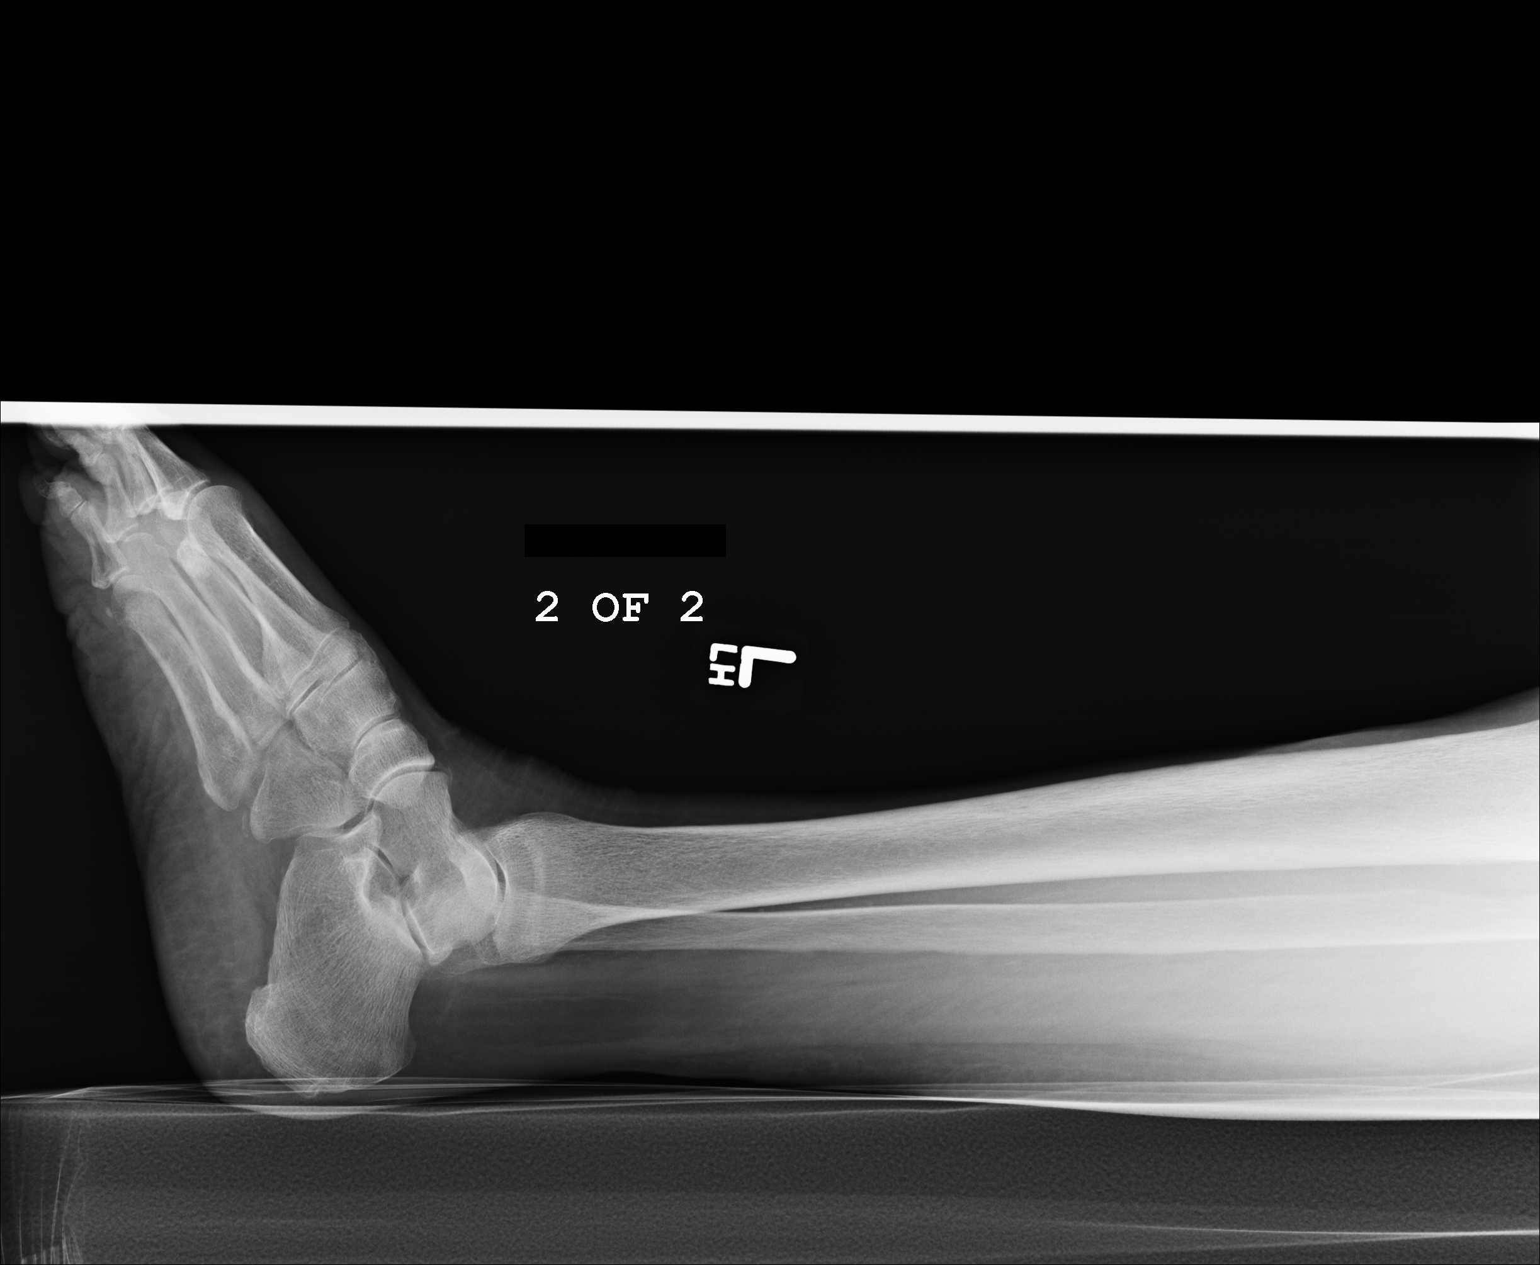

[3 of 3 positions shown; findings below may reference images not displayed]

IMPRESSION: No acute abnormality.

## 2013-05-12 IMAGING — CT CT ABD-PELV W/O CM
1 of 2 series · 16 of 32 positions shown, 20 images · non-contrast
Comparison: none

REASON FOR EXAM: (1) ams, pain; (2) ams, pain
COMMENTS:

PROCEDURE:     CT  - CT ABDOMEN AND PELVIS W[DATE]  [DATE]
RESULT:     History: Pain.
Comparison Study: CT of 04/01/2010

[Series 2: stone · axial · 0.75mm/px · z∈[+2,+368]mm · 16 of 134 slices shown, 20 images]
[im 6/134  soft-tissue]
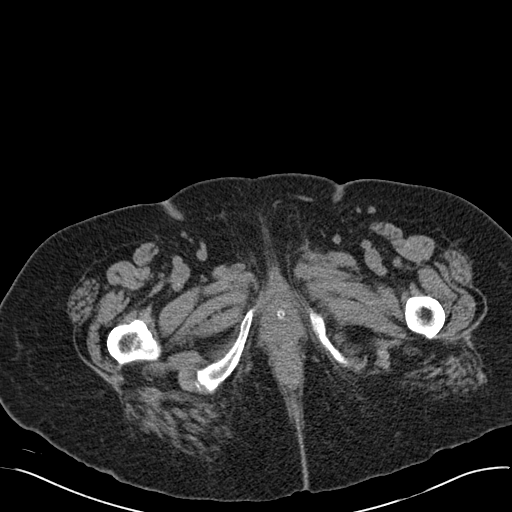
[im 6/134  bone]
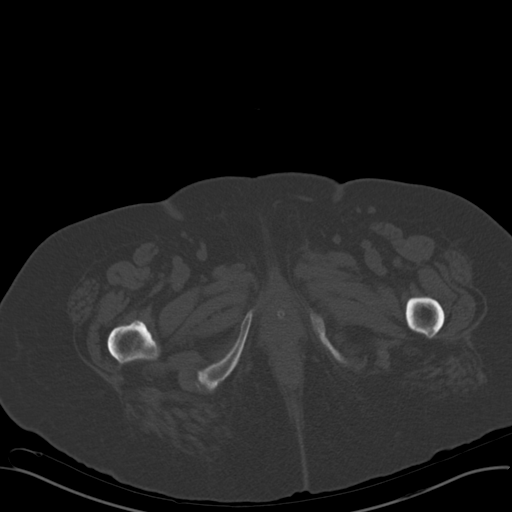
[im 17/134  soft-tissue]
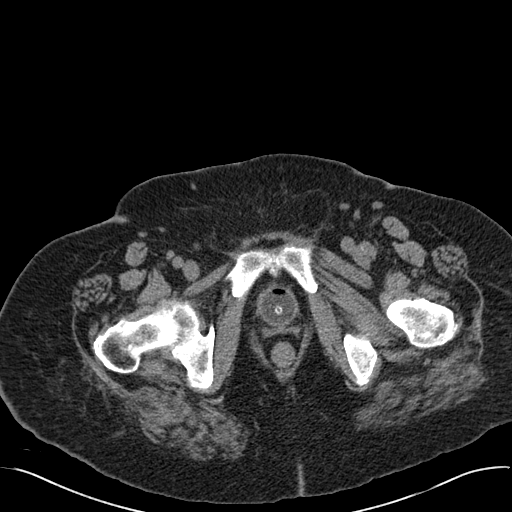
[im 28/134  soft-tissue]
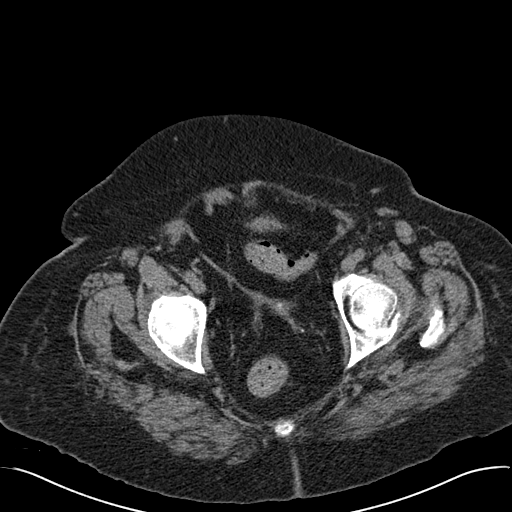
[im 34/134  soft-tissue]
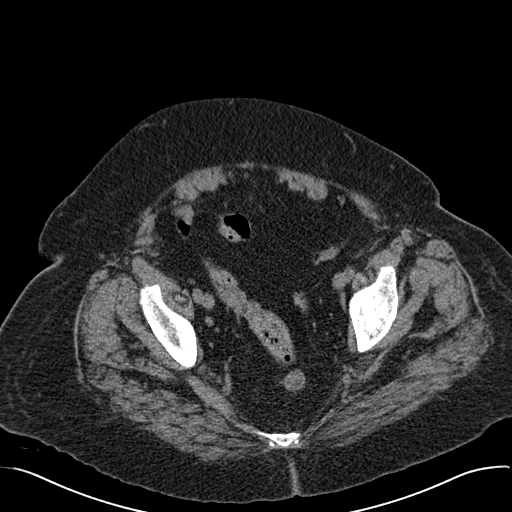
[im 45/134  soft-tissue]
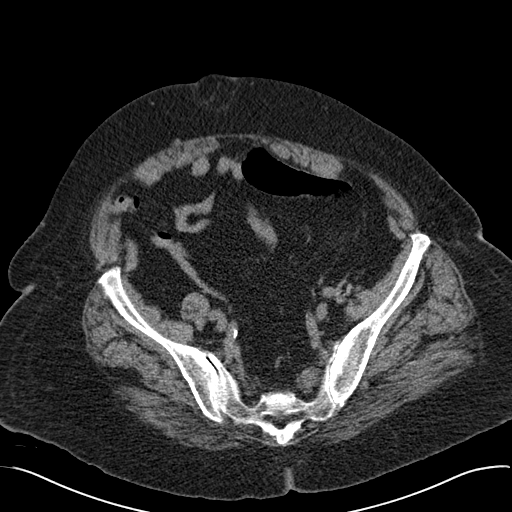
[im 56/134  soft-tissue]
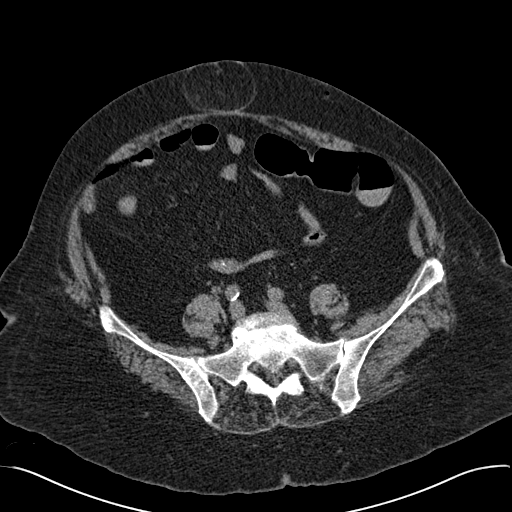
[im 61/134  soft-tissue]
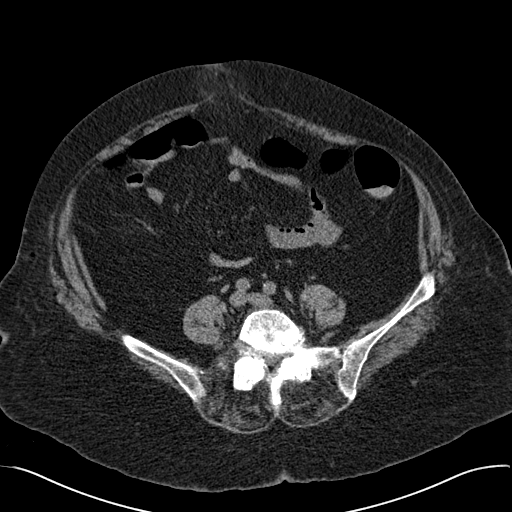
[im 73/134  soft-tissue]
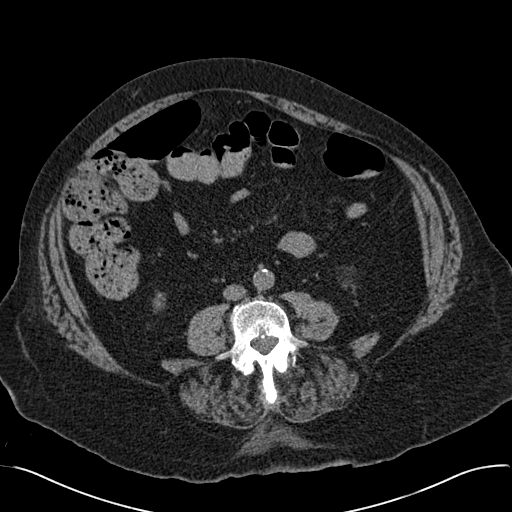
[im 78/134  soft-tissue]
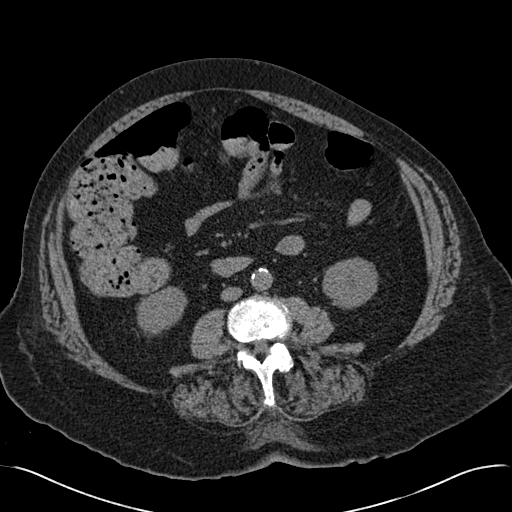
[im 78/134  bone]
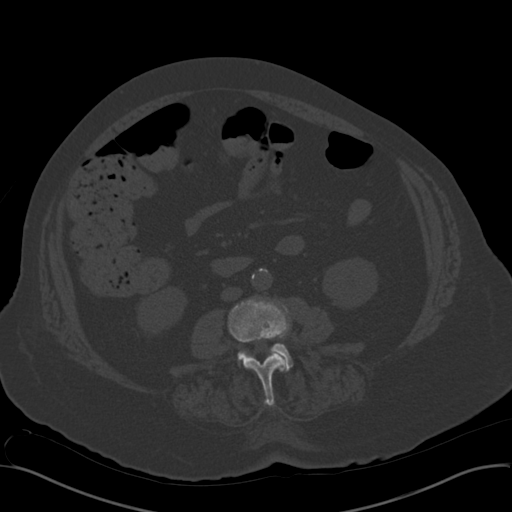
[im 89/134  soft-tissue]
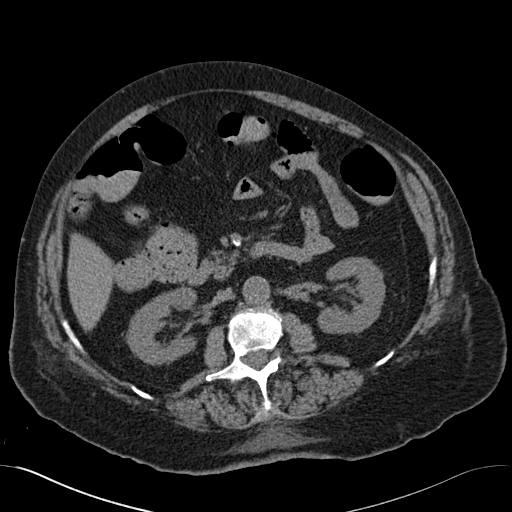
[im 100/134  soft-tissue]
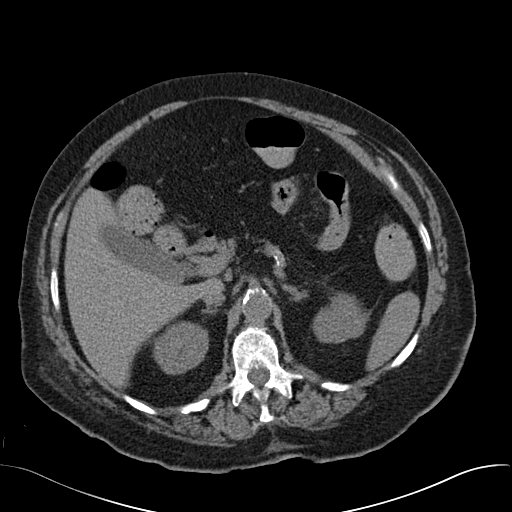
[im 106/134  soft-tissue]
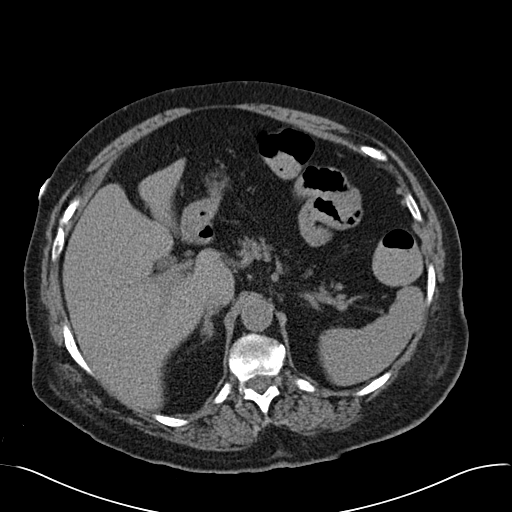
[im 111/134  lung]
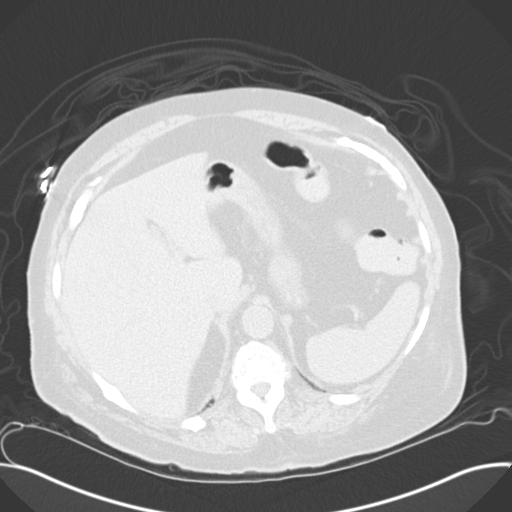
[im 117/134  soft-tissue]
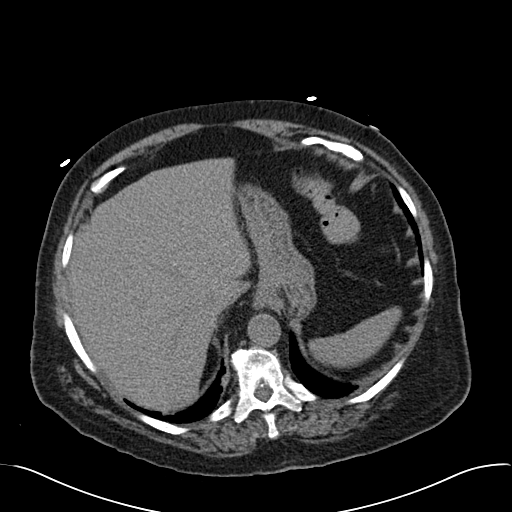
[im 117/134  lung]
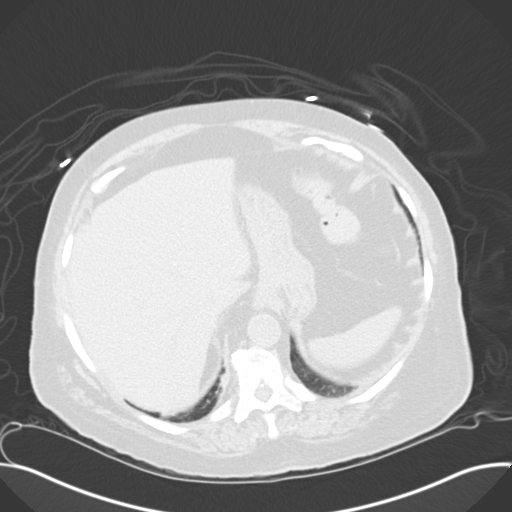
[im 122/134  lung]
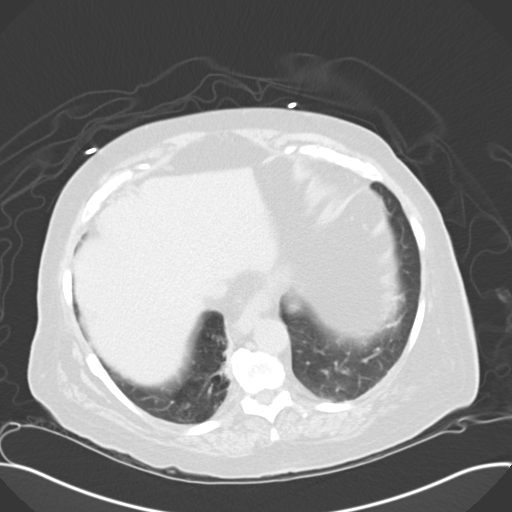
[im 128/134  soft-tissue]
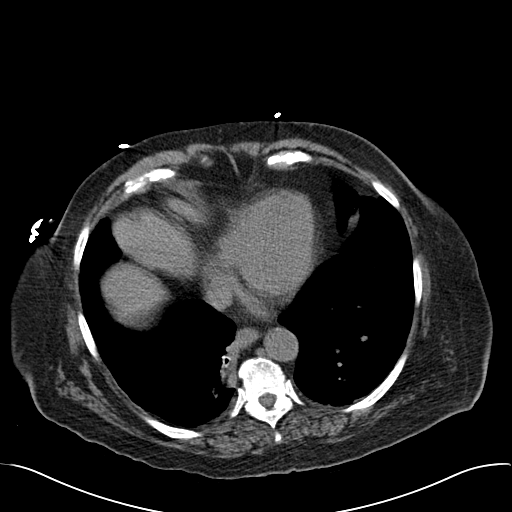
[im 128/134  lung]
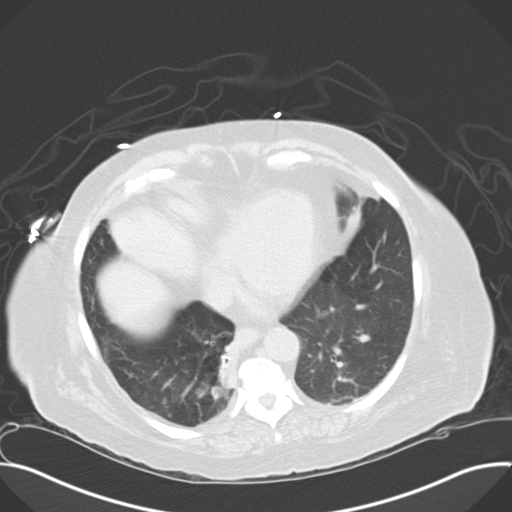

[16 of 32 positions shown; findings below may reference images not displayed]

FINDINGS: Standard nonenhanced CT obtained. Liver normal. Gallbladder
nondistended. Spleen normal. Pancreas normal. Adrenals normal. Kidneys
normal. No hydronephrosis. Foley noted in bladder. Bladder nondistended. No
free pelvic fluid noted. No bowel distention. Right lower quadrant
unremarkable. The stable umbilical hernia is present. Herniation of fat only
noted. Mild atelectasis. No free air noted. Phleboliths noted.
IMPRESSION: No acute abnormality.

## 2013-09-25 IMAGING — CR DG ABDOMEN 2V
1 series · 3 of 3 positions shown · non-contrast
Comparison: none

REASON FOR EXAM: abd pain - left - with umbilical hernia
COMMENTS:

[Series 1: view not recorded · 0.17mm/px · 3 of 3 slices shown]
[im 1/3]
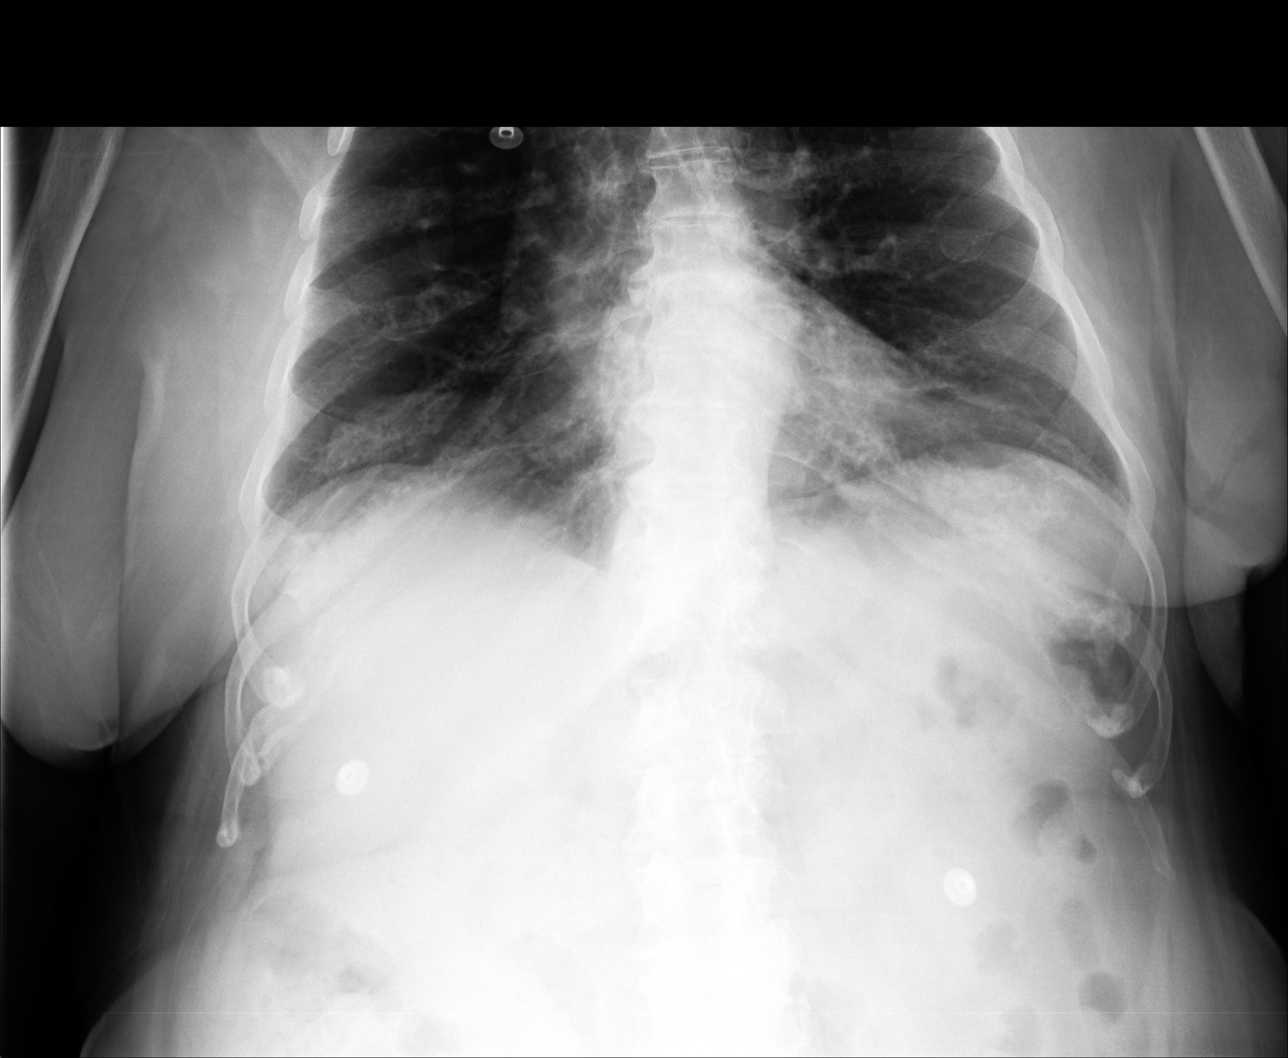
[im 2/3]
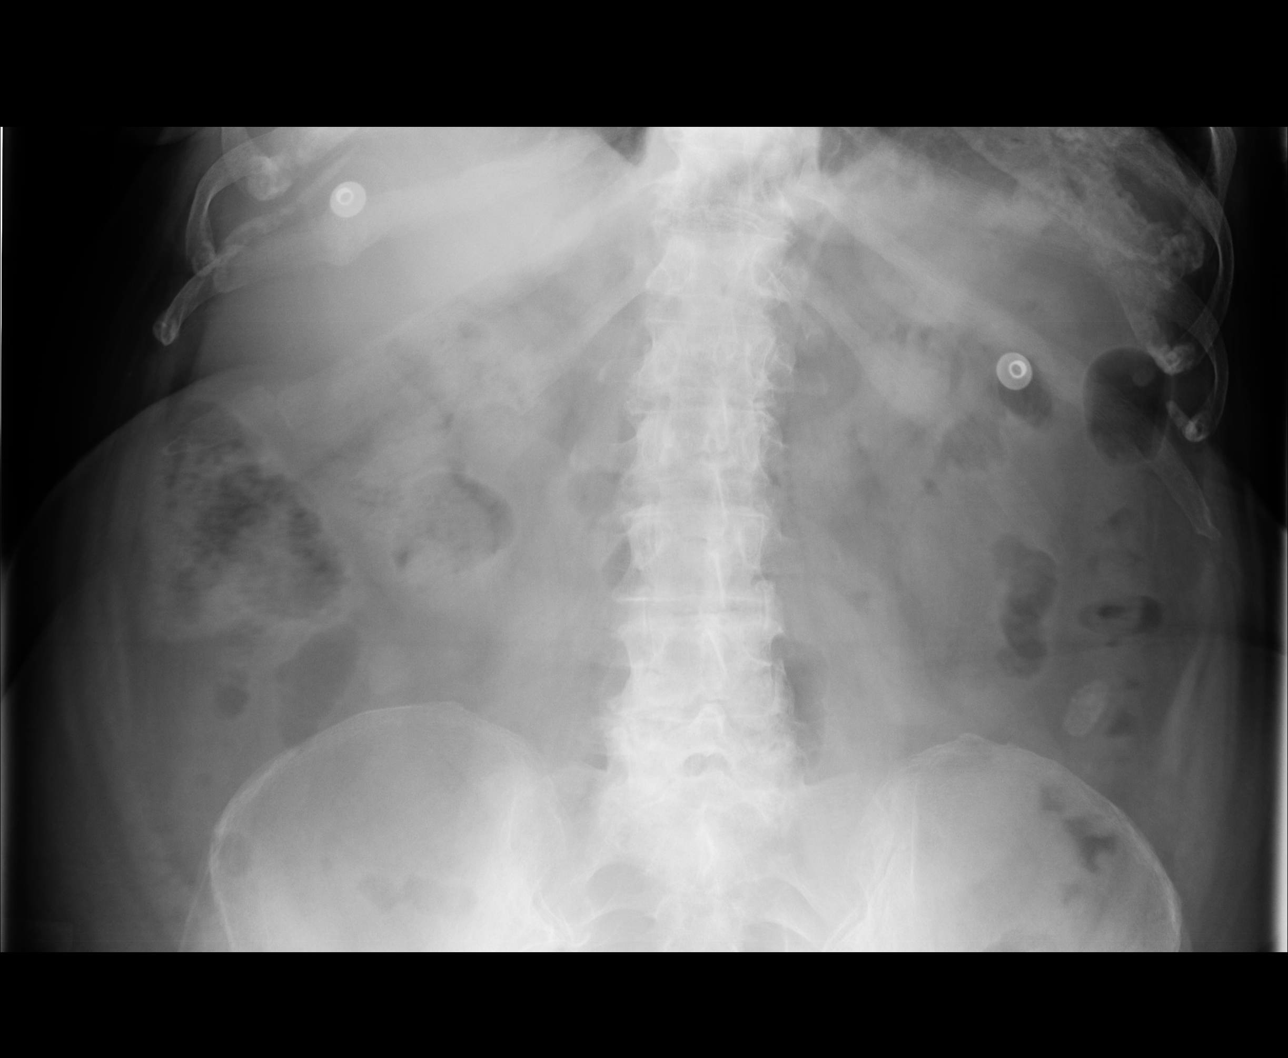
[im 3/3]
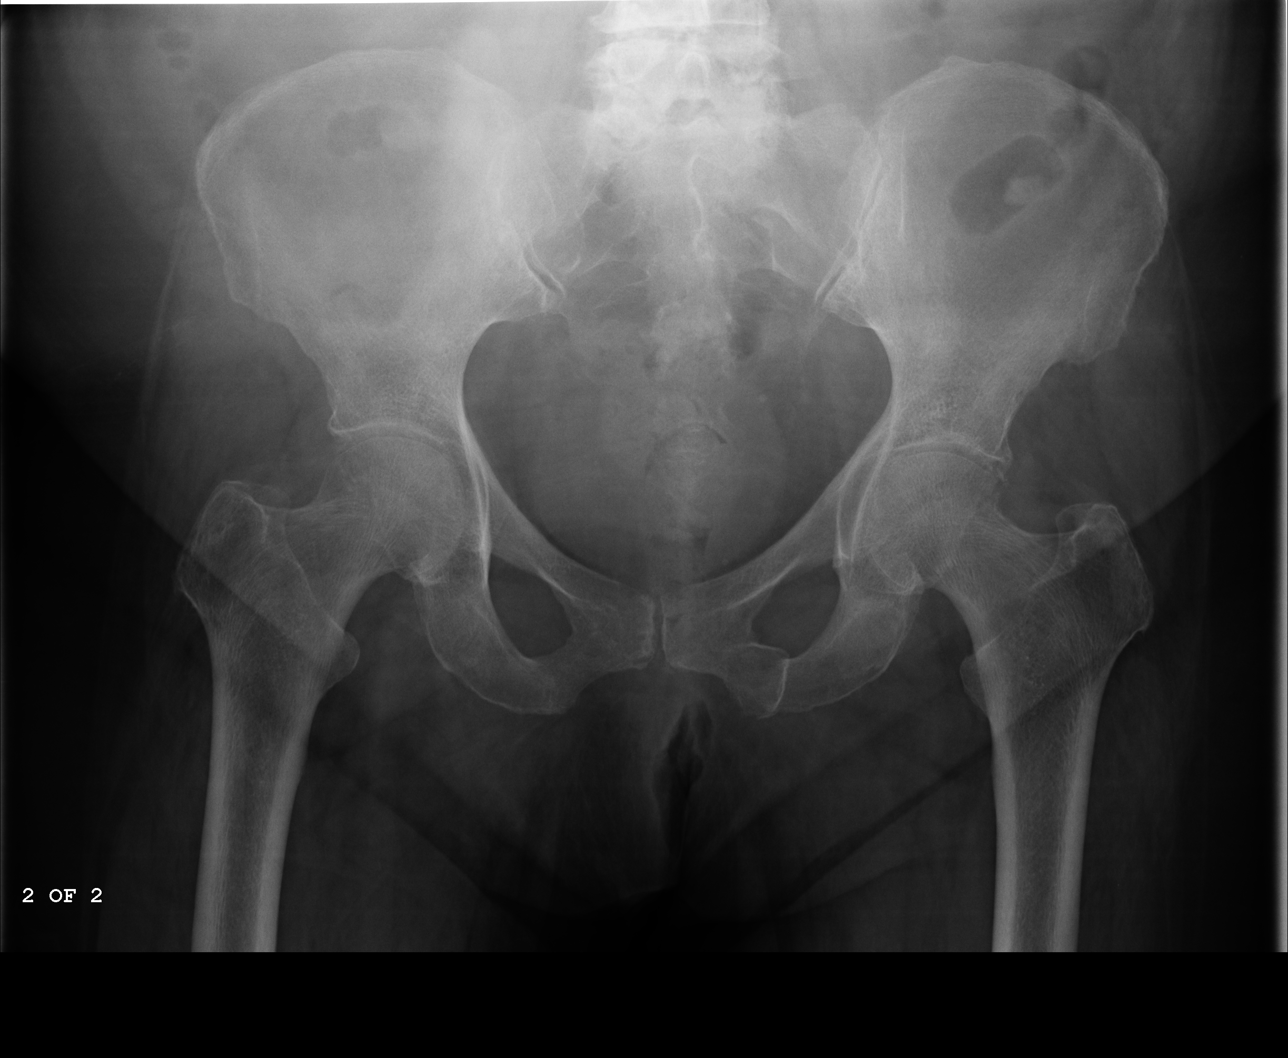

[3 of 3 positions shown; findings below may reference images not displayed]

PROCEDURE:     DXR - DXR ABDOMEN 2 V FLAT AND ERECT  - October 07, 2010  [DATE]

RESULT:     No subdiaphragmatic free air is seen. The bowel gas pattern is
normal. There is a moderate amount of fecal material in the right colon. No
abnormal intraabdominal calcifications are identified. There is a lucent
area at the inferior pubic ramus on the left. The finding is consistent with
residual change from prior trauma and has been present on prior examinations
dating back to 12/13/2004.
IMPRESSION: 1. No subdiaphragmatic free air is seen.
2. The bowel gas pattern shows no specific abnormalities.
3. There is a moderate amount of fecal material in the colon.
4. No acute bony abnormalities are seen.

## 2013-12-05 IMAGING — CR DG CHEST 2V
1 series · 2 of 2 positions shown · non-contrast
Comparison: none

REASON FOR EXAM: respiratory failure
COMMENTS:

[Series 1: ap · 0.17mm/px · 2 of 2 slices shown]
[im 1/2]
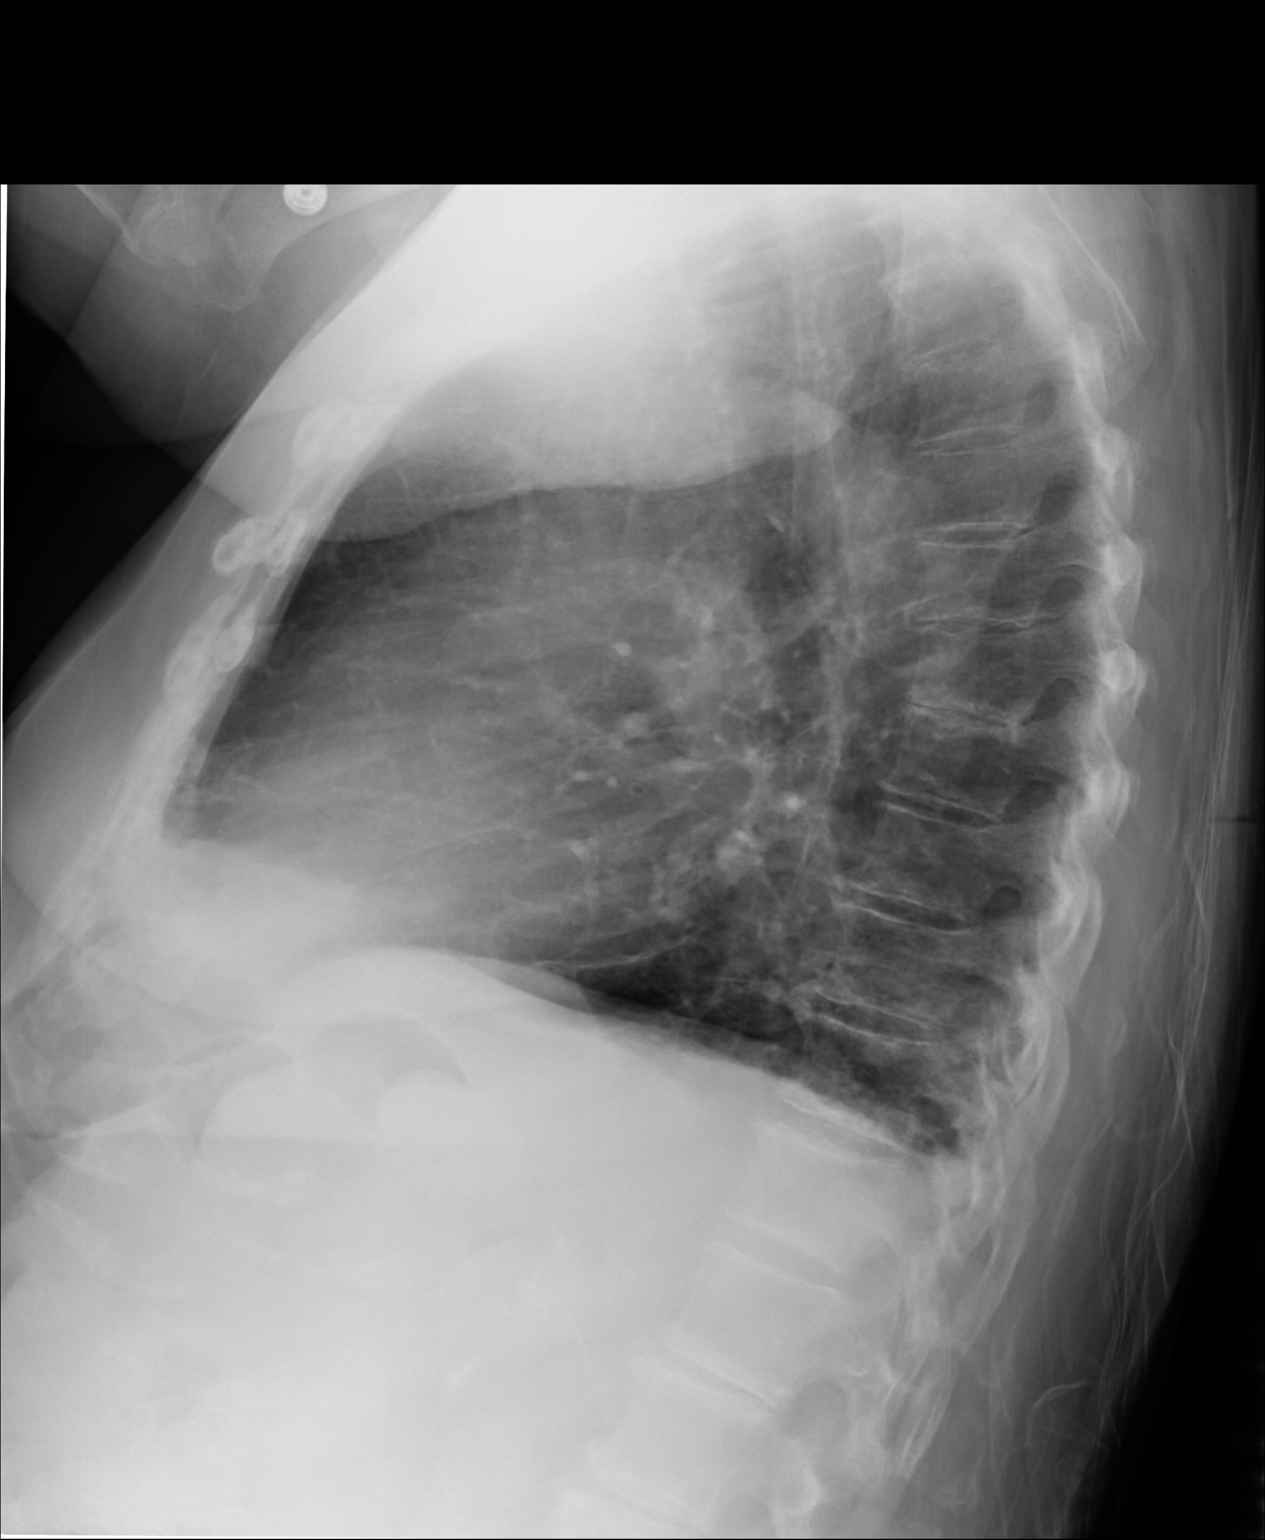
[im 2/2]
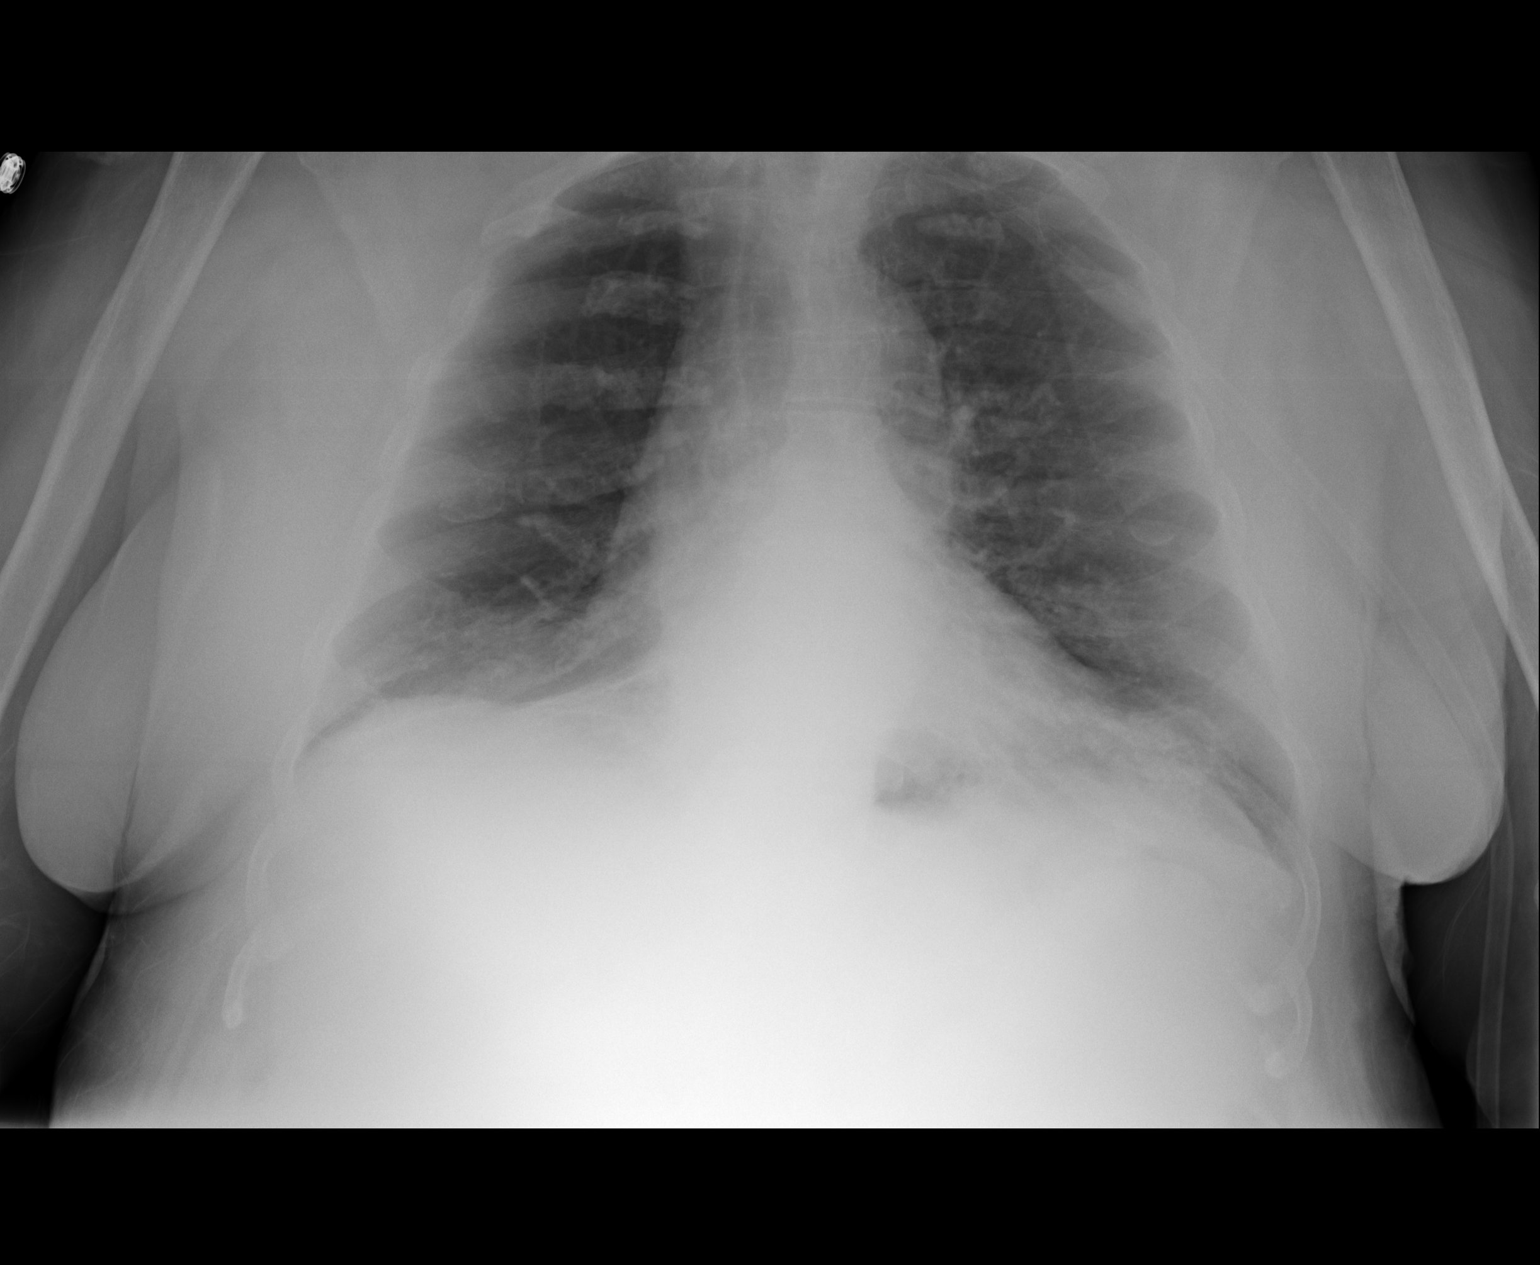

[2 of 2 positions shown; findings below may reference images not displayed]

PROCEDURE:     DXR - DXR CHEST PA (OR AP) AND LATERAL  - December 17, 2010 [DATE]

RESULT:     Comparison is made to the prior exam of yesterday. There is a
mild increase in density at both lung bases, most compatible with bibasilar
atelectasis. Heart size is normal. No pulmonary edema or pleural effusion is
seen. Postsurgical changes are again noted at the left shoulder.
IMPRESSION: There is a minimal increase in density at both lung bases, most compatible
with mild bibasilar basilar atelectasis. The lung fields otherwise are clear.

## 2014-03-03 ENCOUNTER — Ambulatory Visit: Payer: Self-pay

## 2014-03-04 ENCOUNTER — Ambulatory Visit: Payer: Self-pay

## 2014-03-14 ENCOUNTER — Ambulatory Visit: Payer: Self-pay | Admitting: Internal Medicine

## 2014-06-25 ENCOUNTER — Encounter: Payer: Self-pay | Admitting: Emergency Medicine

## 2014-06-25 ENCOUNTER — Other Ambulatory Visit: Payer: Self-pay

## 2014-06-25 ENCOUNTER — Emergency Department

## 2014-06-25 ENCOUNTER — Emergency Department
Admission: EM | Admit: 2014-06-25 | Discharge: 2014-06-25 | Disposition: A | Discharge status: Home / Self Care | Attending: Emergency Medicine | Admitting: Emergency Medicine

## 2014-06-25 DIAGNOSIS — R000 Tachycardia, unspecified: Secondary | ICD-10-CM | POA: Insufficient documentation

## 2014-06-25 DIAGNOSIS — E876 Hypokalemia: Secondary | ICD-10-CM | POA: Insufficient documentation

## 2014-06-25 DIAGNOSIS — J441 Chronic obstructive pulmonary disease with (acute) exacerbation: Secondary | ICD-10-CM | POA: Insufficient documentation

## 2014-06-25 DIAGNOSIS — Z66 Do not resuscitate: Secondary | ICD-10-CM | POA: Insufficient documentation

## 2014-06-25 DIAGNOSIS — J189 Pneumonia, unspecified organism: Secondary | ICD-10-CM | POA: Diagnosis not present

## 2014-06-25 DIAGNOSIS — R05 Cough: Secondary | ICD-10-CM | POA: Diagnosis present

## 2014-06-25 HISTORY — DX: Unspecified kidney failure: N19

## 2014-06-25 HISTORY — DX: Peripheral vascular disease, unspecified: I73.9

## 2014-06-25 HISTORY — DX: Anemia, unspecified: D64.9

## 2014-06-25 HISTORY — DX: Hyperlipidemia, unspecified: E78.5

## 2014-06-25 HISTORY — DX: Heart failure, unspecified: I50.9

## 2014-06-25 HISTORY — DX: Chronic obstructive pulmonary disease, unspecified: J44.9

## 2014-06-25 LAB — TROPONIN I

## 2014-06-25 LAB — COMPREHENSIVE METABOLIC PANEL
ALK PHOS: 117 U/L (ref 38–126)
ALT: 20 U/L (ref 14–54)
AST: 18 U/L (ref 15–41)
Albumin: 2 g/dL — ABNORMAL LOW (ref 3.5–5.0)
Anion gap: 12 (ref 5–15)
BILIRUBIN TOTAL: 0.3 mg/dL (ref 0.3–1.2)
BUN: 15 mg/dL (ref 6–20)
CALCIUM: 9 mg/dL (ref 8.9–10.3)
CHLORIDE: 87 mmol/L — AB (ref 101–111)
CO2: 31 mmol/L (ref 22–32)
Creatinine, Ser: 0.53 mg/dL (ref 0.44–1.00)
GFR calc Af Amer: >60 mL/min (ref >60)
GLUCOSE: 126 mg/dL — AB (ref 65–99)
Potassium: 2.6 mmol/L — CL (ref 3.5–5.1)
SODIUM: 130 mmol/L — AB (ref 135–145)
Total Protein: 6.1 g/dL — ABNORMAL LOW (ref 6.5–8.1)

## 2014-06-25 LAB — CBC WITH DIFFERENTIAL/PLATELET
BASOS ABS: 0 10*3/uL (ref 0–0.1)
BASOS PCT: 0 %
EOS PCT: 0 %
Eosinophils Absolute: 0.1 10*3/uL (ref 0–0.7)
HCT: 29 % — ABNORMAL LOW (ref 35.0–47.0)
Hemoglobin: 9.5 g/dL — ABNORMAL LOW (ref 12.0–16.0)
LYMPHS PCT: 7 %
Lymphs Abs: 1.6 10*3/uL (ref 1.0–3.6)
MCH: 27 pg (ref 26.0–34.0)
MCHC: 32.8 g/dL (ref 32.0–36.0)
MCV: 82.5 fL (ref 80.0–100.0)
Monocytes Absolute: 0.7 10*3/uL (ref 0.2–0.9)
Monocytes Relative: 3 %
Neutro Abs: 21 10*3/uL — ABNORMAL HIGH (ref 1.4–6.5)
Neutrophils Relative %: 90 %
PLATELETS: 380 10*3/uL (ref 150–440)
RBC: 3.52 MIL/uL — ABNORMAL LOW (ref 3.80–5.20)
RDW: 15.6 % — ABNORMAL HIGH (ref 11.5–14.5)
WBC: 23.4 10*3/uL — ABNORMAL HIGH (ref 3.6–11.0)

## 2014-06-25 NOTE — ED Notes (Signed)
Per SNF, patient went on a day trip to the beach last Thursday.  Since returning, patient has had cough and wheezing.  CXR done on 6/9 was abnormal with "areas opacity and blacout".  Patient had repeat CXR today that showed no changes."  Sent to ED for further evaluation.

## 2014-06-25 NOTE — ED Notes (Signed)
AAOx3.  Family at bedside.  Reviewed discharge instructions, good understanding verbalized.

## 2014-06-25 NOTE — ED Provider Notes (Signed)
[name]  ____________________________________________  Time seen: Approximately 7:53 PM  I have reviewed the triage vital signs and the nursing notes.   HISTORY  Chief Complaint Cough  The patient has probable dementia and is minimally communicative at baseline  HPI Priscilla Reed is a 81 y.o. female who is currently under hospice care at her skilled nursing facility.  She was sent over today for a reevaluation or second opinion.  She had a chest x-ray done about one week ago which had some "areas of opacity and blackout".  They sent her over for reevaluation.  Her son is her healthcare power of attorney and he is present in the emergency department this time along with her daughter-in-law.  They did not intend for her to be sent over, but agree with the plan for a second opinion.  She is cared for by Dr. Maryellen Pile who is her primary care doctor and she has a hospice nurse who comes out about every other day.  Her only symptoms are a slow and steady decline in overall status over the last few weeks to months.  She has some coughing and wheezing greater than usual.   Past Medical History  Diagnosis Date  . CHF (congestive heart failure)   . COPD (chronic obstructive pulmonary disease)   . Hyperlipidemia   . Anemia   . Kidney failure   . Peripheral vascular disease     There are no active problems to display for this patient.   Past Surgical History  Procedure Laterality Date  . Replacement total knee    . Shoulder surgery    . Abdominal hysterectomy      No current outpatient prescriptions on file.  Allergies Azithromycin; Codeine; Hydrochlorothiazide; Percocet; and Septra  No family history on file.  Social History History  Substance Use Topics  . Smoking status: Never Smoker   . Smokeless tobacco: Not on file  . Alcohol Use: No    Review of Systems Unable to obtain from the patient due to minimal  communicativeness  ____________________________________________   PHYSICAL EXAM:  VITAL SIGNS: ED Triage Vitals  Enc Vitals Group     BP 06/25/14 1901 115/55 mmHg     Pulse Rate 06/25/14 1901 103     Resp --      Temp 06/25/14 1901 98.5 F (36.9 C)     Temp Source 06/25/14 1901 Oral     SpO2 06/25/14 1901 98 %     Weight 06/25/14 1901 120 lb (54.432 kg)     Height 06/25/14 1901  (1.473 m)     Head Cir --      Peak Flow --      Pain Score --      Pain Loc --      Pain Edu? --      Excl. in GC? --     Constitutional: Chronically ill-appearing elderly female in no acute distress, lying in bed and watching pro wrestling.  Pale Eyes: Conjunctivae are normal. PERRL. EOMI. right eye ptosis Head: Atraumatic. Nose: No congestion/rhinnorhea. Mouth/Throat: Mucous membranes are moist.  Oropharynx non-erythematous. Neck: No stridor.   Cardiovascular: Mild tachycardia, regular rhythm. Grossly normal heart sounds.  Good peripheral circulation. Respiratory: No retractions or accessory muscle usage.  Thick, wet breath sounds with diminished breath sounds on the right.  Occasional cough. Gastrointestinal: Soft and nontender. No distention. No abdominal bruits. No CVA tenderness. Musculoskeletal: No lower extremity tenderness nor edema.  No joint effusions. Neurologic: Moves all 4 extremities minimally.  Her family states that she is at her recent baseline  Skin:  Skin is warm, dry and intact. No rash noted.  ____________________________________________   LABS (all labs ordered are listed, but only abnormal results are displayed)  Labs Reviewed  CBC WITH DIFFERENTIAL/PLATELET - Abnormal; Notable for the following:    WBC 23.4 (*)    RBC 3.52 (*)    Hemoglobin 9.5 (*)    HCT 29.0 (*)    RDW 15.6 (*)    Neutro Abs 21.0 (*)    All other components within normal limits  COMPREHENSIVE METABOLIC PANEL - Abnormal; Notable for the following:    Sodium 130 (*)    Potassium 2.6 (*)     Chloride 87 (*)    Glucose, Bld 126 (*)    Total Protein 6.1 (*)    Albumin 2.0 (*)    All other components within normal limits  TROPONIN I   ____________________________________________  EKG  ED ECG REPORT I, Leolia Vinzant, the attending physician, personally viewed and interpreted this ECG.   Date: 06/25/2014  EKG Time: 20:02  Rate: 104  Rhythm: sinus tachycardia  Axis: Normal  Intervals: Normal  ST&T Change: Non-specific ST segment / T-wave changes, but no evidence of acute ischemia.   ____________________________________________  RADIOLOGY  Dg Chest Portable 1 View  06/25/2014   CLINICAL DATA:  Cough and wheezing.  EXAM: PORTABLE CHEST - 1 VIEW  COMPARISON:  PA and lateral chest 12/17/2010  FINDINGS: There is near complete white out of the right hemi thorax which is new since the prior examination. The patient is rotated on the study. The left lung appears grossly clear. No pneumothorax is identified.  IMPRESSION: Near complete white out of the right chest could be due to pneumonia and effusion. Underlying neoplasm is possible.   Electronically Signed   By: Drusilla Kanner M.D.   On: 06/25/2014 20:26    ____________________________________________   INITIAL IMPRESSION / ASSESSMENT AND PLAN / ED COURSE  Pertinent labs & imaging results that were available during my care of the patient were reviewed by me and considered in my medical decision making (see chart for details).  The patient's workup reveals near white out of the right lung and that, together with her lab work, suggest that she has sepsis from pneumonia.  She is hypokalemic as well.  In spite of her concerning workup, she is in no acute distress and the most reaction I get out of her when talking to her is that she smiles and says, "those are my wrestlers!"  While watching pro wrestling on television.  I discussed various plans of care with her family/healthcare power of attorney, including admission for  comfort care, admission with aggressive treatment of her pneumonia/sepsis, and discharge to her SNF with consultation tomorrow with her PCP and with her hospice nurse.  We had a very frank discussion about her prognosis and what her wishes would be.  Her son/HCPOA decided that he feels it is most appropriate to send her back to her SNF without treatment.  He understands that she likely has overwhelming sepsis and her prognosis is poor.  Again, however, the patient is in no acute distress and I agree with his assessment that this is the right thing for the patient.  ____________________________________________  FINAL CLINICAL IMPRESSION(S) / ED DIAGNOSES  Final diagnoses:  Pneumonia, organism unspecified  Hypokalemia  DNR (do not resuscitate)  NEW MEDICATIONS STARTED DURING THIS VISIT:  New Prescriptions   No medications on file     Loleta Rose, MD 06/25/14 2201

## 2014-06-25 NOTE — ED Notes (Signed)
Patient is an out of hospital DNR and currently under Hospice care.

## 2014-06-25 NOTE — Discharge Instructions (Signed)
As we discussed, Priscilla Reed is very ill with near complete white out of Priscilla Reed right lung with infection, fluid, or both.  Priscilla Reed white blood cell count is very high and she has a low potassium.  We discussed several different options, but we respect your wishes and agree with your decision to not pursue any additional treatment at this time.  We feel it is appropriate to do as we planned and have Priscilla Reed return to [facility] and have you follow-up with Priscilla Reed primary care doctor and Priscilla Reed hospice nurse tomorrow to discuss additional plans.  Note that she can return to the emergency department at any time for reevaluation if you decide to pursue aggressive treatment.  However, as we discussed, it is possible that Priscilla Reed condition is far enough along that even very aggressive treatment would not be successful at reversing Priscilla Reed infection at this time.   Pneumonia, Adult Pneumonia is an infection of the lungs. It may be caused by a germ (virus or bacteria). Some types of pneumonia can spread easily from person to person. This can happen when you cough or sneeze. HOME CARE  Only take medicine as told by your doctor.  Take your medicine (antibiotics) as told. Finish it even if you start to feel better.  Do not smoke.  You may use a vaporizer or humidifier in your room. This can help loosen thick spit (mucus).  Sleep so you are almost sitting up (semi-upright). This helps reduce coughing.  Rest. A shot (vaccine) can help prevent pneumonia. Shots are often advised for:  People over 46 years old.  Patients on chemotherapy.  People with long-term (chronic) lung problems.  People with immune system problems. GET HELP RIGHT AWAY IF:   You are getting worse.  You cannot control your cough, and you are losing sleep.  You cough up blood.  Your pain gets worse, even with medicine.  You have a fever.  Any of your problems are getting worse, not better.  You have shortness of breath or chest  pain. MAKE SURE YOU:   Understand these instructions.  Will watch your condition.  Will get help right away if you are not doing well or get worse. Document Released: 06/17/2007 Document Revised: 03/23/2011 Document Reviewed: 03/21/2010 [facility] Patient Information 2015 [place], [place]. This information is not intended to replace advice given to you by your health care provider. Make sure you discuss any questions you have with your health care provider.

## 2014-07-13 DEATH — deceased

## 2017-03-02 IMAGING — CT CT CHEST-ABD-PELV W/ CM
2 of 5 series · 14 of 36 positions shown, 16 images · IV contrast (omnipaque)
Comparison: Chest radiographs 12/17/2010. CT abdomen and pelvis
05/24/2010. Chest CT 12/15/2004.

CLINICAL DATA: Recurrent pneumonia. Persistent leukocytosis.
Decreased eating. Possible abscess.

EXAM:
CT CHEST, ABDOMEN, AND PELVIS WITH CONTRAST
TECHNIQUE: Multidetector CT imaging of the chest, abdomen and pelvis was
performed following the standard protocol during bolus
administration of intravenous contrast.
CONTRAST:  100 mL Omnipaque 300

[Series 2: cap with · axial · 0.73mm/px · z∈[-886,-412]mm · 11 of 111 slices shown, 13 images]
[im 8/111  mediastinal]
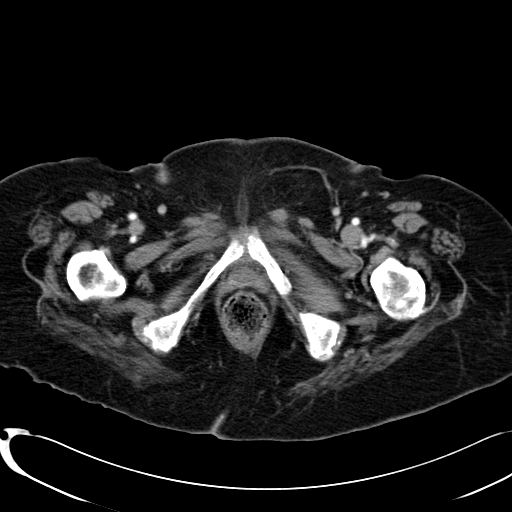
[im 8/111  bone]
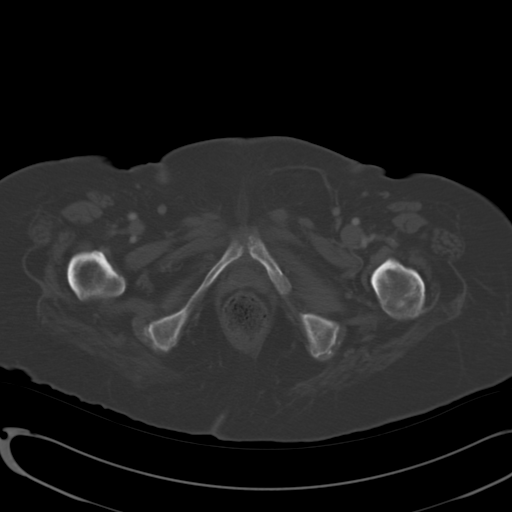
[im 15/111  mediastinal]
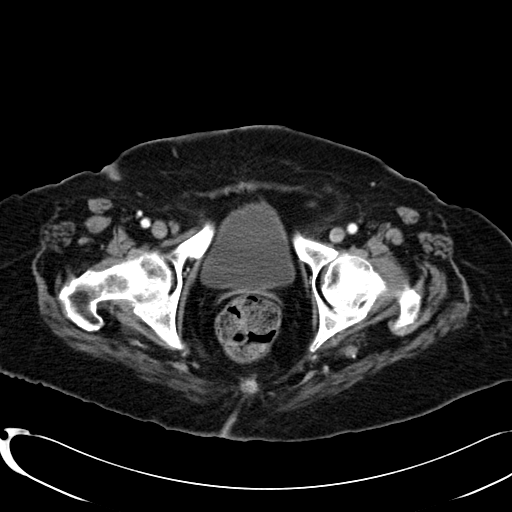
[im 30/111  mediastinal]
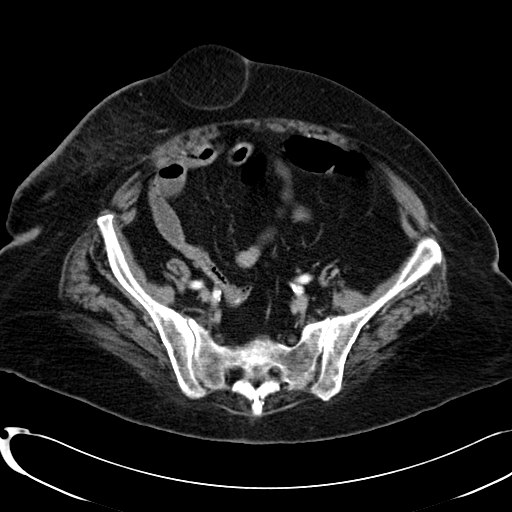
[im 37/111  mediastinal]
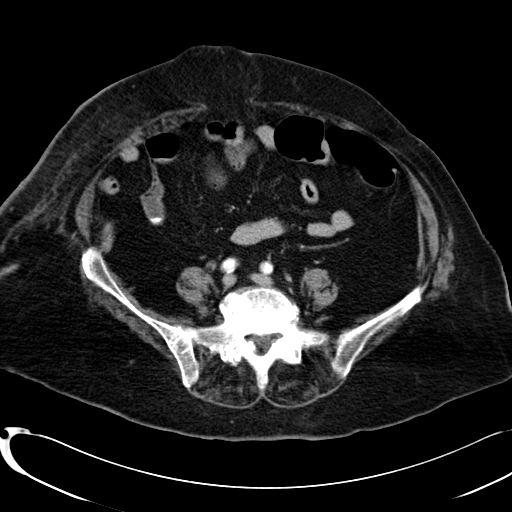
[im 45/111  mediastinal]
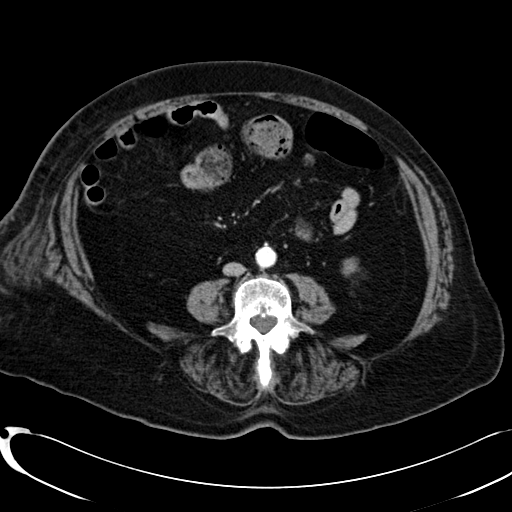
[im 59/111  mediastinal]
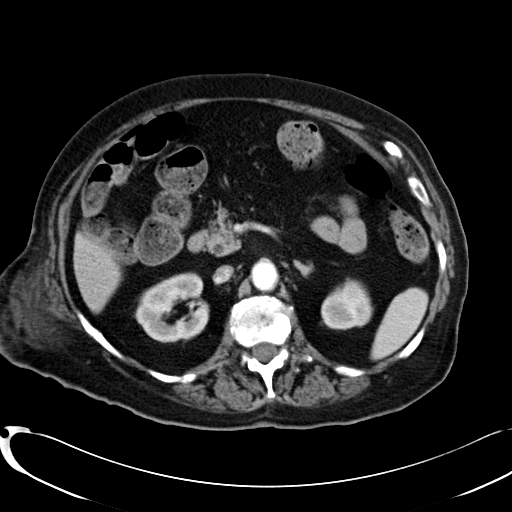
[im 67/111  mediastinal]
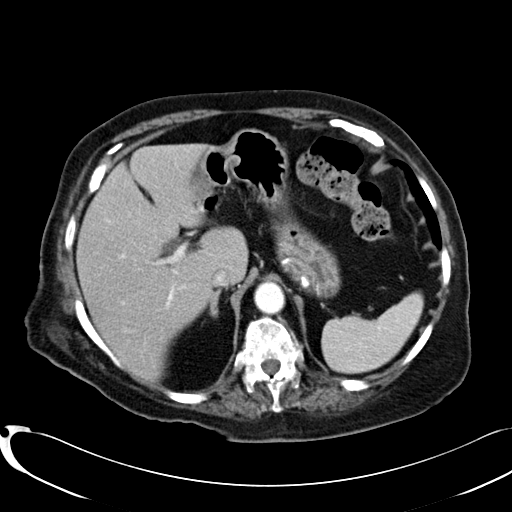
[im 74/111  mediastinal]
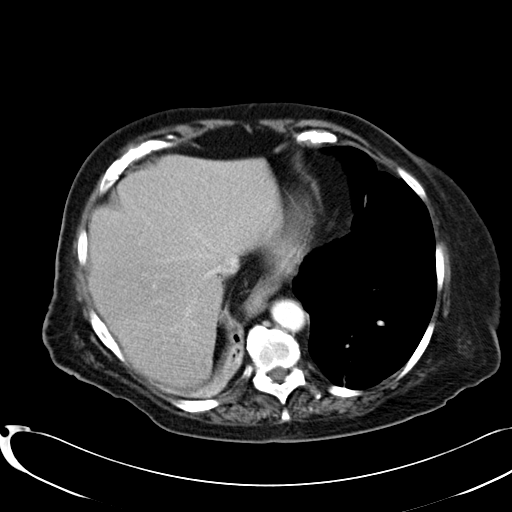
[im 81/111  mediastinal]
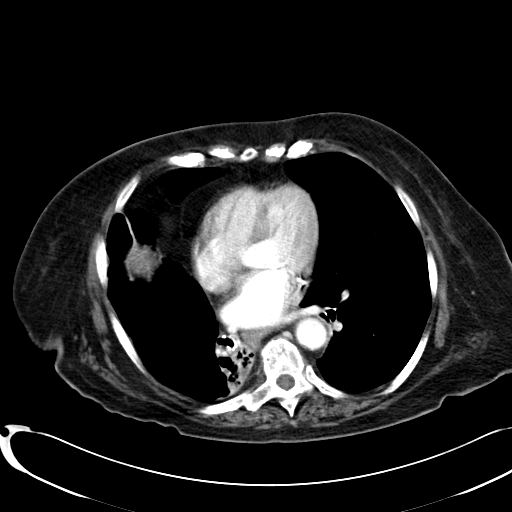
[im 81/111  bone]
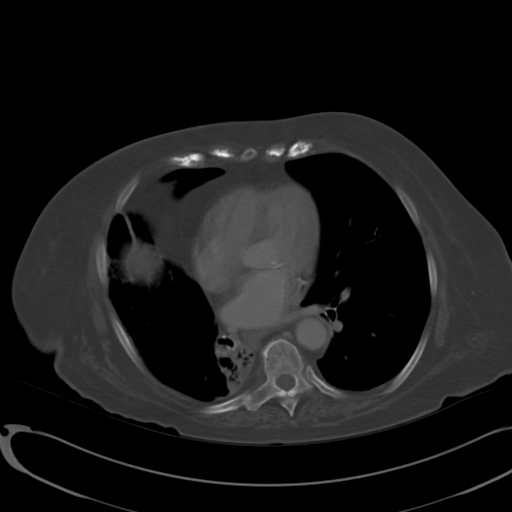
[im 96/111  mediastinal]
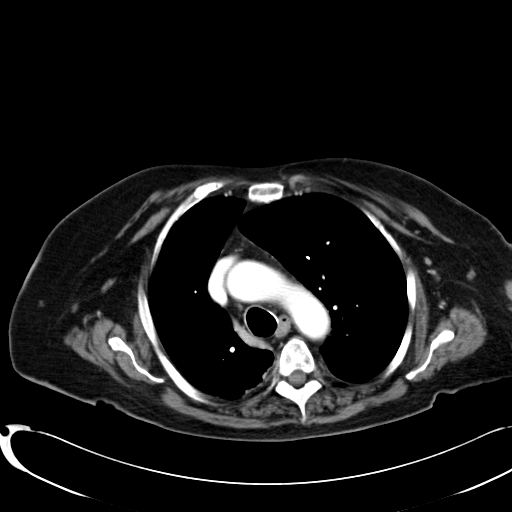
[im 103/111  mediastinal]
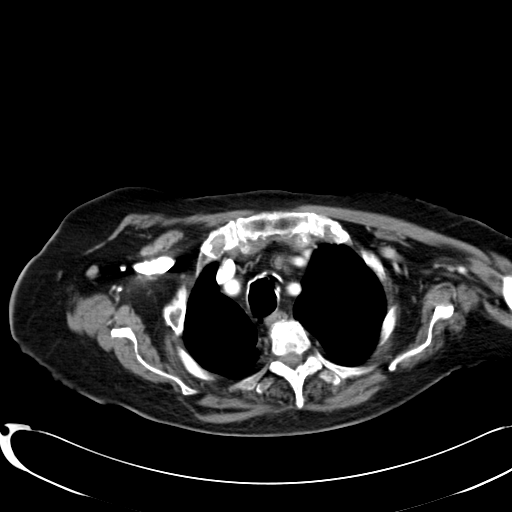

[Series 6: cor cap with cor · coronal · 0.74mm/px · 3 of 182 slices shown]
[im 37/182  mediastinal]
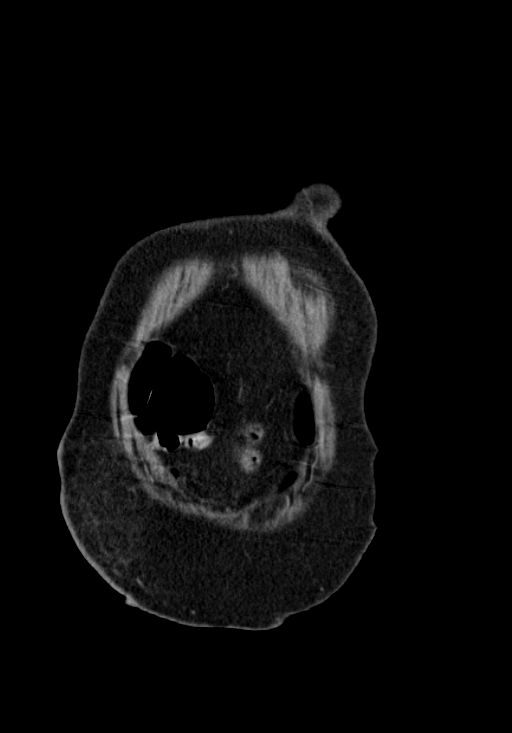
[im 73/182  mediastinal]
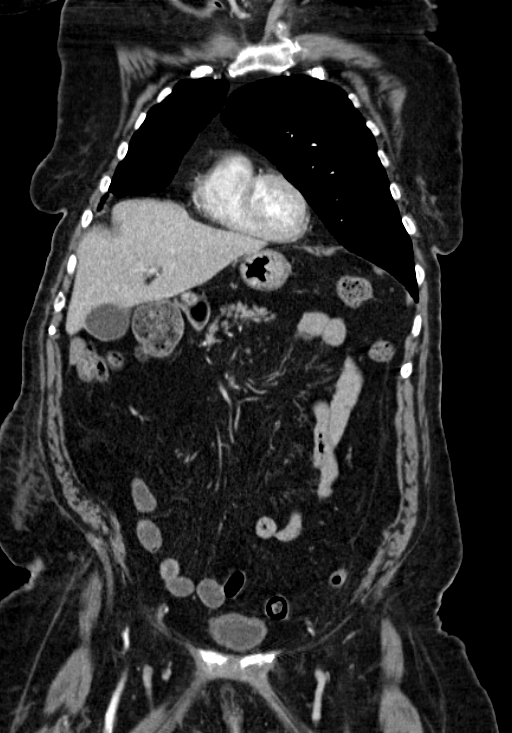
[im 109/182  mediastinal]
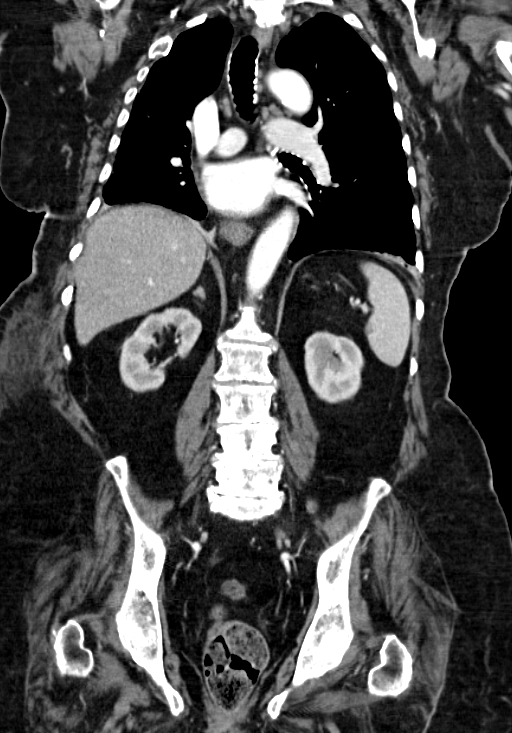

[14 of 36 positions shown; findings below may reference images not displayed]

FINDINGS: CT CHEST FINDINGS

No enlarged axillary, mediastinal, or hilar lymph nodes are
identified. A lower right paratracheal lymph node measures 9 mm in
short axis. Three-vessel coronary artery calcification is noted. The
heart is normal in size. There is no pleural or pericardial
effusion.

There are scattered, patchy areas of tree-in-bud opacities in the
right upper lobe, greatest posteriorly. There is chronic volume loss
in the right lower lobe which has mildly increased from the prior
study. Posteromedial and basilar right lower lobe
collapse/consolidation has increased from the prior CT with
associated air bronchograms and bronchiectasis. Additional adjacent
cystic parenchymal lung changes and/or cystic bronchiectasis are
also again seen and have increased, with some changes in the
posteromedial right upper lobe as well. Minimal opacity in the
basilar left lower lobe may represent subsegmental atelectasis.
Chronic deformity and mixed lytic and sclerotic changes involving
the right second rib are stable.

CT ABDOMEN AND PELVIS FINDINGS

Small layering gallstones are present in the gallbladder. The liver,
spleen, adrenal glands, kidneys, and pancreas are unremarkable.

There is a small sliding hiatal hernia. There is no evidence of
bowel obstruction. Scattered foci of hyperdensity in the lumen of
the stomach and scattered small bowel loops may reflect dense
ingested material. There is a moderate-sized fat containing ventral
abdominal wall hernia right of midline. Bladder is unremarkable.
Uterus is absent. Moderate atherosclerotic calcification is noted of
the abdominal aorta its major branch vessels. A retroaortic left
renal vein is incidentally noted. No free fluid or enlarged lymph
nodes are identified.

There is skin thickening and subcutaneous fat stranding involving
the right lateral anterior abdominal wall and flank with scattered
locular was of subcutaneous gas noted. No fluid collection is seen.
There is associated skin thickening. Moderate thoracolumbar
spondylosis and advanced lower lumbar facet arthrosis are noted.
IMPRESSION: 1. Chronic right lower lobe volume loss and atelectasis with
associated bronchiectasis, increased from prior chest CT. This may
reflect the sequelae of recurrent pneumonia and/or aspiration, with
superimposed active infection possible. An endobronchial lesion is
not excluded, and bronchoscopy could be considered for further
evaluation given recurrent nature.
2. Scattered tree-in-bud densities in the right upper lobe may
reflect endobronchial infection.
3. Cholelithiasis.
4. Small hiatal hernia.
5. Skin thickening and subcutaneous fat stranding in the right
flank. This could reflect cellulitis versus asymmetric edema if the
patient preferentially lies with the right-side-down in a gravity
dependent position. There are foci of gas within this region of
inflammation, which could reflect the sequelae of subcutaneous
medication administration although infection is also possible. No
subcutaneous abscess.
These results will be called to the ordering clinician or
representative by the Radiologist Assistant, and communication
documented in the PACS or zVision Dashboard.

## 2017-06-13 IMAGING — CR DG CHEST 1V PORT
1 series · 1 of 1 positions shown · non-contrast
Comparison: PA and lateral chest 12/17/2010

CLINICAL DATA: Cough and wheezing.

EXAM:
PORTABLE CHEST - 1 VIEW

[ap]
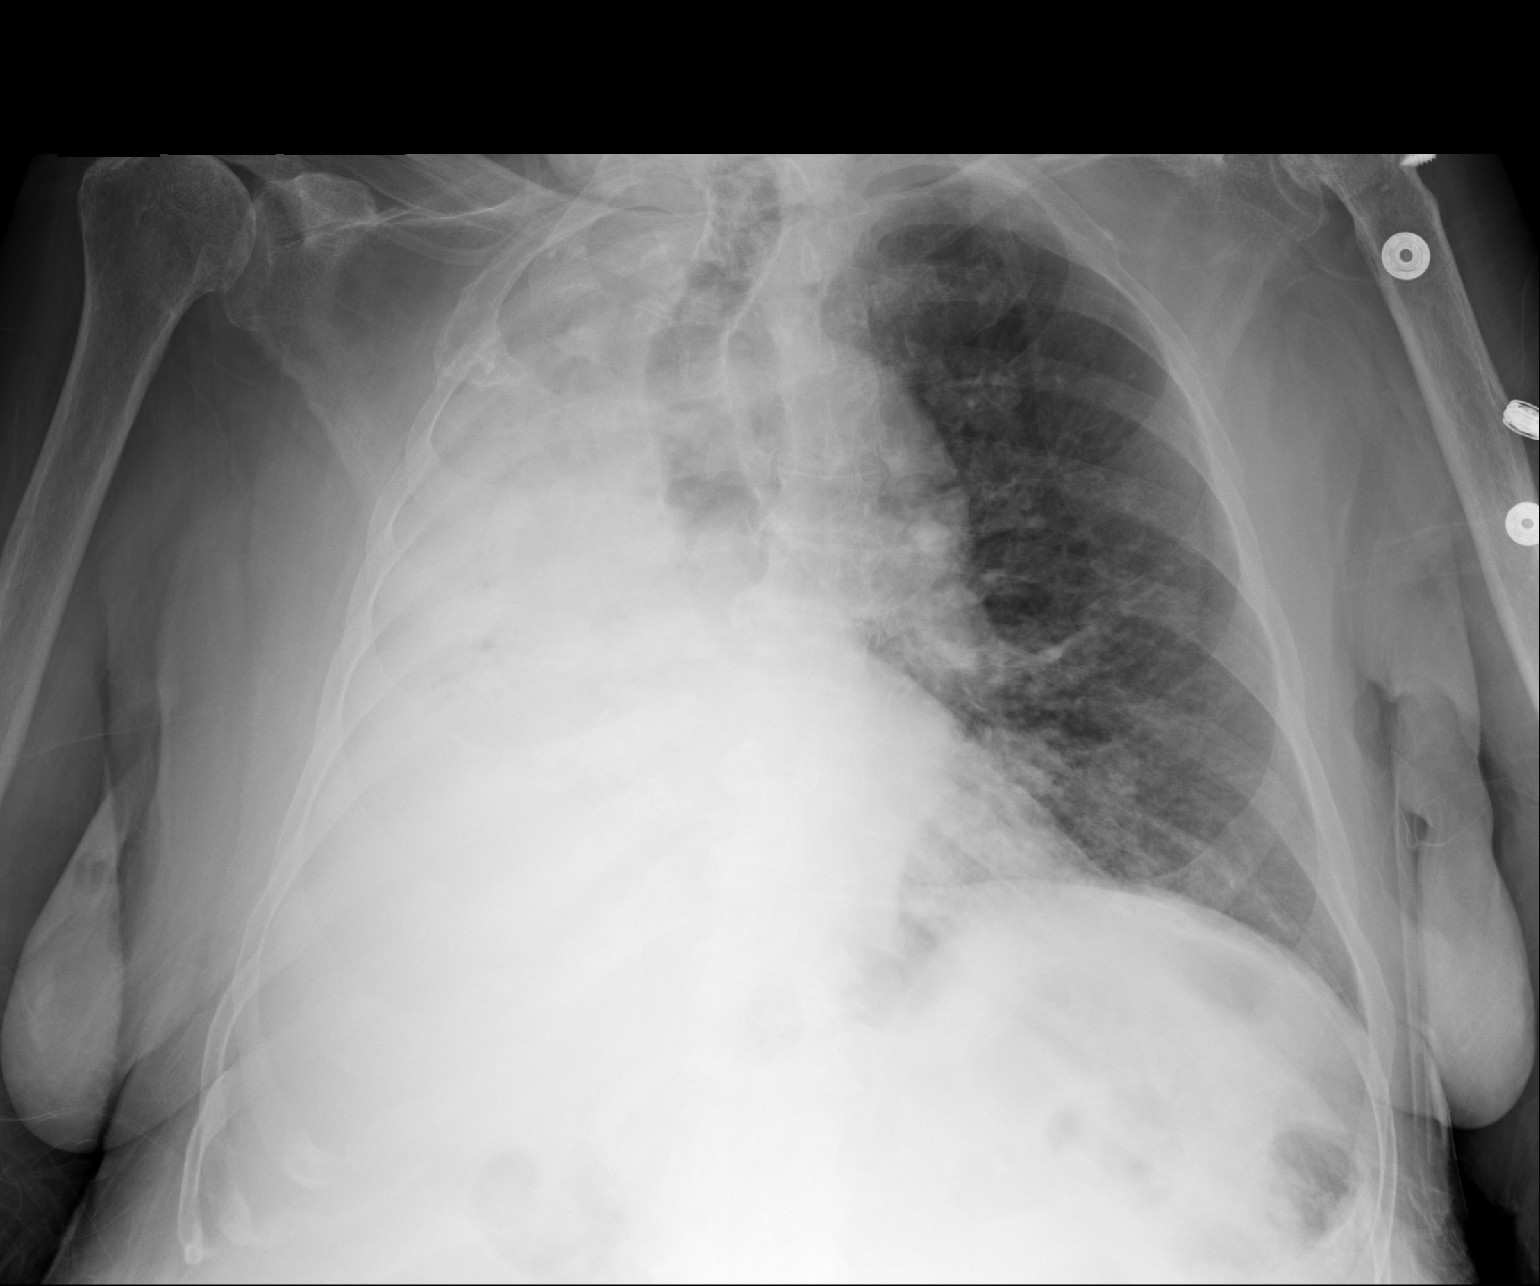

[1 of 1 positions shown; findings below may reference images not displayed]

FINDINGS: There is near complete white out of the right hemi thorax which is
new since the prior examination. The patient is rotated on the
study. The left lung appears grossly clear. No pneumothorax is
identified.
IMPRESSION: Near complete white out of the right chest could be due to pneumonia
and effusion. Underlying neoplasm is possible.
